# Patient Record
Sex: Female | Born: 1985 | Race: Black or African American | Hispanic: No | Marital: Single | Smoking: Never smoker
Health system: Southern US, Community
[De-identification: ages and names within clinical notes are randomized; demographics above are authoritative.]

## PROBLEM LIST (undated history)

## (undated) DIAGNOSIS — E282 Polycystic ovarian syndrome: Secondary | ICD-10-CM

## (undated) DIAGNOSIS — I10 Essential (primary) hypertension: Secondary | ICD-10-CM

## (undated) DIAGNOSIS — G43909 Migraine, unspecified, not intractable, without status migrainosus: Secondary | ICD-10-CM

## (undated) DIAGNOSIS — F32A Depression, unspecified: Secondary | ICD-10-CM

## (undated) DIAGNOSIS — R32 Unspecified urinary incontinence: Secondary | ICD-10-CM

## (undated) DIAGNOSIS — F329 Major depressive disorder, single episode, unspecified: Secondary | ICD-10-CM

## (undated) DIAGNOSIS — J45909 Unspecified asthma, uncomplicated: Secondary | ICD-10-CM

## (undated) HISTORY — DX: Migraine, unspecified, not intractable, without status migrainosus: G43.909

## (undated) HISTORY — DX: Unspecified asthma, uncomplicated: J45.909

## (undated) HISTORY — PX: TONSILLECTOMY: SUR1361

## (undated) HISTORY — DX: Depression, unspecified: F32.A

## (undated) HISTORY — DX: Essential (primary) hypertension: I10

## (undated) HISTORY — DX: Unspecified urinary incontinence: R32

---
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
## 2006-03-10 ENCOUNTER — Ambulatory Visit (HOSPITAL_COMMUNITY): Admission: RE | Admit: 2006-03-10 | Discharge: 2006-03-10 | Payer: Self-pay | Admitting: Family Medicine

## 2007-04-06 ENCOUNTER — Ambulatory Visit (HOSPITAL_COMMUNITY): Admission: RE | Admit: 2007-04-06 | Discharge: 2007-04-06 | Payer: Self-pay | Admitting: Family Medicine

## 2007-10-05 ENCOUNTER — Observation Stay: Payer: Self-pay | Admitting: General Surgery

## 2008-06-22 DIAGNOSIS — O169 Unspecified maternal hypertension, unspecified trimester: Secondary | ICD-10-CM

## 2008-07-09 ENCOUNTER — Emergency Department: Payer: Self-pay | Admitting: Emergency Medicine

## 2009-02-07 ENCOUNTER — Observation Stay: Payer: Self-pay | Admitting: Obstetrics and Gynecology

## 2009-02-11 ENCOUNTER — Observation Stay: Payer: Self-pay

## 2009-02-15 ENCOUNTER — Observation Stay: Payer: Self-pay | Admitting: Obstetrics and Gynecology

## 2009-02-18 ENCOUNTER — Inpatient Hospital Stay: Payer: Self-pay | Admitting: Unknown Physician Specialty

## 2010-12-10 ENCOUNTER — Other Ambulatory Visit: Payer: Self-pay | Admitting: Obstetrics and Gynecology

## 2010-12-10 DIAGNOSIS — N644 Mastodynia: Secondary | ICD-10-CM

## 2010-12-12 ENCOUNTER — Ambulatory Visit
Admission: RE | Admit: 2010-12-12 | Discharge: 2010-12-12 | Disposition: A | Discharge status: Home / Self Care | Payer: 59 | Source: Ambulatory Visit | Attending: Obstetrics and Gynecology | Admitting: Obstetrics and Gynecology

## 2010-12-12 DIAGNOSIS — N644 Mastodynia: Secondary | ICD-10-CM

## 2014-02-21 ENCOUNTER — Ambulatory Visit: Payer: 59 | Admitting: Cardiology

## 2014-03-13 ENCOUNTER — Encounter: Payer: Self-pay | Admitting: Cardiology

## 2014-08-07 ENCOUNTER — Emergency Department: Payer: Self-pay | Admitting: Student

## 2014-09-15 ENCOUNTER — Emergency Department: Payer: Self-pay | Admitting: Emergency Medicine

## 2015-08-25 ENCOUNTER — Encounter (HOSPITAL_COMMUNITY): Payer: Self-pay

## 2015-08-25 ENCOUNTER — Emergency Department (HOSPITAL_COMMUNITY): Payer: BLUE CROSS/BLUE SHIELD

## 2015-08-25 DIAGNOSIS — Z8639 Personal history of other endocrine, nutritional and metabolic disease: Secondary | ICD-10-CM | POA: Diagnosis not present

## 2015-08-25 DIAGNOSIS — Z880 Allergy status to penicillin: Secondary | ICD-10-CM | POA: Diagnosis not present

## 2015-08-25 DIAGNOSIS — R079 Chest pain, unspecified: Secondary | ICD-10-CM | POA: Insufficient documentation

## 2015-08-25 DIAGNOSIS — R110 Nausea: Secondary | ICD-10-CM | POA: Diagnosis not present

## 2015-08-25 DIAGNOSIS — Z87891 Personal history of nicotine dependence: Secondary | ICD-10-CM | POA: Insufficient documentation

## 2015-08-25 LAB — I-STAT TROPONIN, ED: TROPONIN I, POC: 0 ng/mL (ref 0.00–0.08)

## 2015-08-25 LAB — CBC
HCT: 38 % (ref 36.0–46.0)
Hemoglobin: 12.1 g/dL (ref 12.0–15.0)
MCH: 27.4 pg (ref 26.0–34.0)
MCHC: 31.8 g/dL (ref 30.0–36.0)
MCV: 86.2 fL (ref 78.0–100.0)
PLATELETS: 278 10*3/uL (ref 150–400)
RBC: 4.41 MIL/uL (ref 3.87–5.11)
RDW: 13.6 % (ref 11.5–15.5)
WBC: 8 10*3/uL (ref 4.0–10.5)

## 2015-08-25 NOTE — ED Notes (Signed)
Pt reports onset 7pm left upper chest pain, intermittant shortness of breath and nausea.  NO cold/cough, fever, sweating noted.  NAD at triage.

## 2015-08-26 ENCOUNTER — Emergency Department (HOSPITAL_COMMUNITY)
Admission: EM | Admit: 2015-08-26 | Discharge: 2015-08-26 | Disposition: A | Discharge status: Home / Self Care | Payer: BLUE CROSS/BLUE SHIELD | Attending: Emergency Medicine | Admitting: Emergency Medicine

## 2015-08-26 DIAGNOSIS — R079 Chest pain, unspecified: Secondary | ICD-10-CM

## 2015-08-26 HISTORY — DX: Polycystic ovarian syndrome: E28.2

## 2015-08-26 LAB — BASIC METABOLIC PANEL
Anion gap: 11 (ref 5–15)
BUN: 9 mg/dL (ref 6–20)
CO2: 27 mmol/L (ref 22–32)
CREATININE: 1.05 mg/dL — AB (ref 0.44–1.00)
Calcium: 9 mg/dL (ref 8.9–10.3)
Chloride: 102 mmol/L (ref 101–111)
GFR calc Af Amer: >60 mL/min (ref >60)
GLUCOSE: 100 mg/dL — AB (ref 65–99)
POTASSIUM: 3.6 mmol/L (ref 3.5–5.1)
Sodium: 140 mmol/L (ref 135–145)

## 2015-08-26 MED ORDER — IBUPROFEN 400 MG PO TABS: 400.0000 mg | ORAL_TABLET | Freq: Four times a day (QID) | ORAL | Status: DC | PRN

## 2015-08-26 NOTE — ED Notes (Signed)
Patient verbalized understanding of discharge instructions and denies any further needs or questions at this time. VS stable. Patient ambulatory with steady gait. Declined wheelchair.

## 2015-08-26 NOTE — ED Provider Notes (Signed)
CSN: [id number]     Arrival date & time 08/25/15  2248 History  By signing my name below, I, [name], attest that this documentation has been prepared under the direction and in the presence of Benjiman CoreNathan Nyra Anspaugh, MD. Electronically Signed: [name], ED Scribe. 08/26/2015. 2:51 AM.     Chief Complaint  Patient presents with  . Chest Pain   The history is provided by the patient. No language interpreter was used.  HPI Comments: Shannon [name] is a 30 y.o. female who presents to the Emergency Department complaining of sudden onset, throbbing, non-radiating, gradually resolving left upper chest pain onset 7 hours ago. Pt reports associated nausea, tingling and "heaviness" of the left arm. She denies worsening or alleviating factors. Pt is on BC. She reports hx of similar symptoms months ago that went away on its own. She denies hx of smoking, new physical activity, fever, chills, diaphoresis, cough, leg swelling, current SOB, vomiting, or diarrhea.   Past Medical History  Diagnosis Date  . PCOS (polycystic ovarian syndrome)    Past Surgical History  Procedure Laterality Date  . Tonsillectomy     History reviewed. No pertinent family history. Social History  Substance Use Topics  . Smoking status: Former Games developermoker  . Smokeless tobacco: None  . Alcohol Use: No   OB History    No data available     Review of Systems  Constitutional: Negative for fever and diaphoresis.  Respiratory: Negative for cough and shortness of breath.   Cardiovascular: Positive for chest pain. Negative for leg swelling.  Gastrointestinal: Positive for nausea. Negative for vomiting and diarrhea.  All other systems reviewed and are negative.  Allergies  Clindamycin/lincomycin and Penicillins  Home Medications   Prior to Admission medications   Medication Sig Start Date End Date Taking? Authorizing Provider  ibuprofen (ADVIL,MOTRIN) 400 MG tablet Take 1 tablet (400 mg total) by mouth every 6 (six)  hours as needed. 08/26/15   Benjiman CoreNathan Roshan Salamon, MD   BP 133/79 mmHg  Pulse 79  Temp(Src) 98.7 F (37.1 C) (Oral)  Resp 15  Ht 5\' 8"  (1.727 m)  Wt 213 lb (96.616 kg)  BMI 32.39 kg/m2  SpO2 100%  LMP  Physical Exam  Constitutional: She is oriented to person, place, and time. She appears well-developed and well-nourished.  HENT:  Head: Normocephalic and atraumatic.  Eyes: EOM are normal. Pupils are equal, round, and reactive to light.  Neck: Normal range of motion. Neck supple.  Cardiovascular: Normal rate, regular rhythm and normal heart sounds.  Exam reveals no gallop and no friction rub.   No murmur heard. Pulmonary/Chest: Effort normal and breath sounds normal. She has no wheezes. She exhibits no tenderness.  Abdominal: Soft. There is no tenderness.  Musculoskeletal: Normal range of motion. She exhibits no edema.  Neurological: She is alert and oriented to person, place, and time.  Skin: Skin is warm and dry.  Psychiatric: She has a normal mood and affect. Her behavior is normal.  Nursing note and vitals reviewed.   ED Course  Procedures (including critical care time) DIAGNOSTIC STUDIES: Oxygen Saturation is 100% on RA, normal by my interpretation.    COORDINATION OF CARE: 2:50 AM-Discussed treatment plan which includes labs, EKG, x-ray, and anti-inflammatories with pt at bedside and pt agreed to plan.    Labs Review Labs Reviewed  BASIC METABOLIC PANEL - Abnormal; Notable for the following:    Glucose, Bld 100 (*)    Creatinine, Ser 1.05 (*)  All other components within normal limits  CBC  I-STAT TROPOININ, ED    Imaging Review Dg Chest 2 View  08/25/2015  CLINICAL DATA:  Chest pain and shortness of breath since 7 p.m. today. EXAM: CHEST  2 VIEW COMPARISON:  10/05/2007 FINDINGS: The heart size and mediastinal contours are within normal limits. Both lungs are clear. The visualized skeletal structures are unremarkable. IMPRESSION: No active cardiopulmonary disease.  Electronically Signed   By: Burman Nieves M.D.   On: 08/25/2015 23:53   I have personally reviewed and evaluated these images and lab results as part of my medical decision-making.   EKG Interpretation   Date/Time:  Sunday August 25 2015 22:51:52 EST Ventricular Rate:  83 PR Interval:  122 QRS Duration: 92 QT Interval:  352 QTC Calculation: 413 R Axis:   58 Text Interpretation:  Normal sinus rhythm Normal ECG Confirmed by  Dain Laseter  MD, Maryse Brierley [phone]) on 08/26/2015 2:42:51 AM      MDM   Final diagnoses:  Chest pain, unspecified chest pain type    Patient with chest pain. Left-sided with some radiation the arm. Enzymes negative. EKG reassuring. Negative chest x-ray. Low risk for pulmonary embolism. No abdominal pain. Doubt aortic cause. Will discharge home. I personally performed the services described in this documentation, which was scribed in my presence. The recorded information has been reviewed and is accurate.      Benjiman Core, MD 08/26/15 [phone]

## 2015-08-26 NOTE — Discharge Instructions (Signed)
Nonspecific Chest Pain  °Chest pain can be caused by many different conditions. There is always a chance that your pain could be related to something serious, such as a heart attack or a blood clot in your lungs. Chest pain can also be caused by conditions that are not life-threatening. If you have chest pain, it is very important to follow up with your health care provider. °CAUSES  °Chest pain can be caused by: °· Heartburn. °· Pneumonia or bronchitis. °· Anxiety or stress. °· Inflammation around your heart (pericarditis) or lung (pleuritis or pleurisy). °· A blood clot in your lung. °· A collapsed lung (pneumothorax). It can develop suddenly on its own (spontaneous pneumothorax) or from trauma to the chest. °· Shingles infection (varicella-zoster virus). °· Heart attack. °· Damage to the bones, muscles, and cartilage that make up your chest wall. This can include: °¨ Bruised bones due to injury. °¨ Strained muscles or cartilage due to frequent or repeated coughing or overwork. °¨ Fracture to one or more ribs. °¨ Sore cartilage due to inflammation (costochondritis). °RISK FACTORS  °Risk factors for chest pain may include: °· Activities that increase your risk for trauma or injury to your chest. °· Respiratory infections or conditions that cause frequent coughing. °· Medical conditions or overeating that can cause heartburn. °· Heart disease or family history of heart disease. °· Conditions or health behaviors that increase your risk of developing a blood clot. °· Having had chicken pox (varicella zoster). °SIGNS AND SYMPTOMS °Chest pain can feel like: °· Burning or tingling on the surface of your chest or deep in your chest. °· Crushing, pressure, aching, or squeezing pain. °· Dull or sharp pain that is worse when you move, cough, or take a deep breath. °· Pain that is also felt in your back, neck, shoulder, or arm, or pain that spreads to any of these areas. °Your chest pain may come and go, or it may stay  constant. °DIAGNOSIS °Lab tests or other studies may be needed to find the cause of your pain. Your health care provider may have you take a test called an ambulatory ECG (electrocardiogram). An ECG records your heartbeat patterns at the time the test is performed. You may also have other tests, such as: °· Transthoracic echocardiogram (TTE). During echocardiography, sound waves are used to create a picture of all of the heart structures and to look at how blood flows through your heart. °· Transesophageal echocardiogram (TEE). This is a more advanced imaging test that obtains images from inside your body. It allows your health care provider to see your heart in finer detail. °· Cardiac monitoring. This allows your health care provider to monitor your heart rate and rhythm in real time. °· Holter monitor. This is a portable device that records your heartbeat and can help to diagnose abnormal heartbeats. It allows your health care provider to track your heart activity for several days, if needed. °· Stress tests. These can be done through exercise or by taking medicine that makes your heart beat more quickly. °· Blood tests. °· Imaging tests. °TREATMENT  °Your treatment depends on what is causing your chest pain. Treatment may include: °· Medicines. These may include: °¨ Acid blockers for heartburn. °¨ Anti-inflammatory medicine. °¨ Pain medicine for inflammatory conditions. °¨ Antibiotic medicine, if an infection is present. °¨ Medicines to dissolve blood clots. °¨ Medicines to treat coronary artery disease. °· Supportive care for conditions that do not require medicines. This may include: °¨ Resting. °¨ Applying heat   or cold packs to injured areas. °¨ Limiting activities until pain decreases. °HOME CARE INSTRUCTIONS °· If you were prescribed an antibiotic medicine, finish it all even if you start to feel better. °· Avoid any activities that bring on chest pain. °· Do not use any tobacco products, including  cigarettes, chewing tobacco, or electronic cigarettes. If you need help quitting, ask your health care provider. °· Do not drink alcohol. °· Take medicines only as directed by your health care provider. °· Keep all follow-up visits as directed by your health care provider. This is important. This includes any further testing if your chest pain does not go away. °· If heartburn is the cause for your chest pain, you may be told to keep your head raised (elevated) while sleeping. This reduces the chance that acid will go from your stomach into your esophagus. °· Make lifestyle changes as directed by your health care provider. These may include: °¨ Getting regular exercise. Ask your health care provider to suggest some activities that are safe for you. °¨ Eating a heart-healthy diet. A registered dietitian can help you to learn healthy eating options. °¨ Maintaining a healthy weight. °¨ Managing diabetes, if necessary. °¨ Reducing stress. °SEEK MEDICAL CARE IF: °· Your chest pain does not go away after treatment. °· You have a rash with blisters on your chest. °· You have a fever. °SEEK IMMEDIATE MEDICAL CARE IF:  °· Your chest pain is worse. °· You have an increasing cough, or you cough up blood. °· You have severe abdominal pain. °· You have severe weakness. °· You faint. °· You have chills. °· You have sudden, unexplained chest discomfort. °· You have sudden, unexplained discomfort in your arms, back, neck, or jaw. °· You have shortness of breath at any time. °· You suddenly start to sweat, or your skin gets clammy. °· You feel nauseous or you vomit. °· You suddenly feel light-headed or dizzy. °· Your heart begins to beat quickly, or it feels like it is skipping beats. °These symptoms may represent a serious problem that is an emergency. Do not wait to see if the symptoms will go away. Get medical help right away. Call your local emergency services (911 in the [place] Do not drive yourself to the hospital. °  °This  information is not intended to replace advice given to you by your health care provider. Make sure you discuss any questions you have with your health care provider. °  °Document Released: 03/18/2005 Document Revised: 06/29/2014 Document Reviewed: 01/12/2014 °Elsevier Interactive Patient Education ©2016 [employer] ° °

## 2015-09-30 ENCOUNTER — Encounter: Payer: Self-pay | Admitting: Emergency Medicine

## 2015-09-30 DIAGNOSIS — T17228A Food in pharynx causing other injury, initial encounter: Secondary | ICD-10-CM | POA: Diagnosis not present

## 2015-09-30 DIAGNOSIS — Y999 Unspecified external cause status: Secondary | ICD-10-CM | POA: Diagnosis not present

## 2015-09-30 DIAGNOSIS — W458XXA Other foreign body or object entering through skin, initial encounter: Secondary | ICD-10-CM | POA: Diagnosis not present

## 2015-09-30 DIAGNOSIS — Y929 Unspecified place or not applicable: Secondary | ICD-10-CM | POA: Diagnosis not present

## 2015-09-30 DIAGNOSIS — Y9389 Activity, other specified: Secondary | ICD-10-CM | POA: Diagnosis not present

## 2015-09-30 DIAGNOSIS — Z87891 Personal history of nicotine dependence: Secondary | ICD-10-CM | POA: Insufficient documentation

## 2015-09-30 NOTE — ED Notes (Signed)
Patient ambulatory to triage with steady gait, without difficulty or distress noted; pt reports eating fish & feels as if something stuck in throat; [facility] and has been there since 330pm; left due to long wait

## 2015-10-01 ENCOUNTER — Emergency Department
Admission: EM | Admit: 2015-10-01 | Discharge: 2015-10-01 | Disposition: A | Discharge status: Home / Self Care | Payer: BLUE CROSS/BLUE SHIELD | Attending: Emergency Medicine | Admitting: Emergency Medicine

## 2015-10-01 ENCOUNTER — Emergency Department: Payer: BLUE CROSS/BLUE SHIELD

## 2015-10-01 DIAGNOSIS — R0989 Other specified symptoms and signs involving the circulatory and respiratory systems: Secondary | ICD-10-CM

## 2015-10-01 MED ORDER — MAGIC MOUTHWASH
10.0000 mL | Freq: Once | ORAL | Status: AC
  Administered 2015-10-01: 10 mL via ORAL
  Filled 2015-10-01: qty 10

## 2015-10-01 MED ORDER — HYDROCODONE-ACETAMINOPHEN 7.5-325 MG/15ML PO SOLN: 10.0000 mL | Freq: Four times a day (QID) | ORAL | Status: AC | PRN

## 2015-10-01 MED ORDER — MAGIC MOUTHWASH: 5.0000 mL | Freq: Three times a day (TID) | ORAL | Status: DC | PRN

## 2015-10-01 MED ORDER — HYDROCODONE-ACETAMINOPHEN 7.5-325 MG/15ML PO SOLN
10.0000 mL | Freq: Once | ORAL | Status: AC
  Administered 2015-10-01: 10 mL via ORAL
  Filled 2015-10-01: qty 15

## 2015-10-01 NOTE — ED Notes (Signed)
Ginger ale given to pt.  

## 2015-10-01 NOTE — ED Provider Notes (Signed)
[facility] Emergency Department Provider Note  ____________________________________________  Time seen: Approximately 1:20 AM  I have reviewed the triage vital signs and the nursing notes.   HISTORY  Chief Complaint Foreign Body    HPI Shannon [name] is a 30 y.o. female who presents to the ED from home with a chief complaint of foreign body sensation. Patient reports she was eating fish and swallowed a fishbone at approximately 2 PM. Reports [facility], waited [facility] hours, left and presented to our emergency department. Patient states she felt like the fishbone was lodged horizontally in her throat for approximately one hour, then started to work its way down vertically. Reports that she is able to swallow liquids without difficulty. Denies associated fever, chills, chest pain, shortness of breath, drooling, muffled voice, abdominal pain, nausea, vomiting, diarrhea. Denies recent travel or trauma. Nothing makes her symptoms better or worse.   Past Medical History  Diagnosis Date  . PCOS (polycystic ovarian syndrome)     There are no active problems to display for this patient.   Past Surgical History  Procedure Laterality Date  . Tonsillectomy      Current Outpatient Rx  Name  Route  Sig  Dispense  Refill  . ibuprofen (ADVIL,MOTRIN) 400 MG tablet   Oral   Take 1 tablet (400 mg total) by mouth every 6 (six) hours as needed.   10 tablet   0     Allergies Clindamycin/lincomycin and Penicillins  No family history on file.  Social History Social History  Substance Use Topics  . Smoking status: Former Games developermoker  . Smokeless tobacco: None  . Alcohol Use: No    Review of Systems  Constitutional: No fever/chills. Eyes: No visual changes. ENT: Positive for foreign body sensation in throat. No sore throat. Cardiovascular: Denies chest pain. Respiratory: Denies shortness of breath. Gastrointestinal: No abdominal pain.  No nausea, no vomiting.   No diarrhea.  No constipation. Genitourinary: Negative for dysuria. Musculoskeletal: Negative for back pain. Skin: Negative for rash. Neurological: Negative for headaches, focal weakness or numbness.  10-point ROS otherwise negative.  ____________________________________________   PHYSICAL EXAM:  VITAL SIGNS: ED Triage Vitals  Enc Vitals Group     BP 09/30/15 2356 135/83 mmHg     Pulse Rate 09/30/15 2356 73     Resp 09/30/15 2356 20     Temp 09/30/15 2356 97.9 F (36.6 C)     Temp Source 09/30/15 2356 Oral     SpO2 09/30/15 2356 99 %     Weight 09/30/15 2356 212 lb (96.163 kg)     Height 09/30/15 2356 5\' 7"  (1.702 m)     Head Cir --      Peak Flow --      Pain Score 09/30/15 2355 7     Pain Loc --      Pain Edu? --      Excl. in GC? --     Constitutional: Alert and oriented. Well appearing and in no acute distress. Eyes: Conjunctivae are normal. PERRL. EOMI. Head: Atraumatic. Nose: No congestion/rhinnorhea. Mouth/Throat: Mucous membranes are moist.  Oropharynx slightly erythematous.  No FB visible. Neck: No stridor.  No palpable FB. No crepitus. Cardiovascular: Normal rate, regular rhythm. Grossly normal heart sounds.  Good peripheral circulation. Respiratory: Normal respiratory effort.  No retractions. Lungs CTAB. Gastrointestinal: Soft and nontender. No distention. No abdominal bruits. No CVA tenderness. Musculoskeletal: No lower extremity tenderness nor edema.  No joint effusions. Neurologic:  Normal  speech and language. No gross focal neurologic deficits are appreciated. No gait instability. Skin:  Skin is warm, dry and intact. No rash noted. Psychiatric: Mood and affect are normal. Speech and behavior are normal.  ____________________________________________   LABS (all labs ordered are listed, but only abnormal results are displayed)  Labs Reviewed - No data to  display ____________________________________________  EKG  None ____________________________________________  RADIOLOGY  Soft tissue neck (viewed by me, interpreted per Dr. Clovis Riley): Negative ____________________________________________   PROCEDURES  Procedure(s) performed: None  Critical Care performed: No  ____________________________________________   INITIAL IMPRESSION / ASSESSMENT AND PLAN / ED COURSE  Pertinent labs & imaging results that were available during my care of the patient were reviewed by me and considered in my medical decision making (see chart for details).  30 year old female who presents with foreign body sensation in throat. She is not in respiratory distress, no drooling or spitting. Will try Magic mouthwash and reassess.  ----------------------------------------- 2:55 AM on 10/01/2015 -----------------------------------------  Patient is feeling much better. Denies foreign body sensation. Complains of soreness to throat. Will add Lortab elixir. Strict return precautions given. Patient and spouse verbalize understanding and agree with plan of care. ____________________________________________   FINAL CLINICAL IMPRESSION(S) / ED DIAGNOSES  Final diagnoses:  Sensation of foreign body in throat      Shannon Hong, MD 10/01/15 [phone]

## 2015-10-01 NOTE — Discharge Instructions (Signed)
1. You may use Magic mouthwash and Lortab elixir as needed for throat discomfort. 2. Return to the ER for worsening symptoms, persistent vomiting, fever, difficulty breathing or other concerns.

## 2015-10-01 NOTE — ED Notes (Signed)
Pt states she was eating a fish and swallowed a bone. Pt [facility], left and came here. Pt talking in complete sentences, states she is spitting. Pt states she is able to swallow. Denies coughing, has not taken anything for pain.

## 2017-01-13 DIAGNOSIS — K5900 Constipation, unspecified: Secondary | ICD-10-CM | POA: Insufficient documentation

## 2017-01-13 DIAGNOSIS — K644 Residual hemorrhoidal skin tags: Secondary | ICD-10-CM | POA: Insufficient documentation

## 2017-01-13 DIAGNOSIS — N3011 Interstitial cystitis (chronic) with hematuria: Secondary | ICD-10-CM | POA: Insufficient documentation

## 2017-01-14 ENCOUNTER — Emergency Department: Payer: Self-pay

## 2017-01-14 ENCOUNTER — Emergency Department
Admission: EM | Admit: 2017-01-14 | Discharge: 2017-01-14 | Disposition: A | Discharge status: Home / Self Care | Payer: Self-pay | Attending: Emergency Medicine | Admitting: Emergency Medicine

## 2017-01-14 ENCOUNTER — Encounter: Payer: Self-pay | Admitting: Emergency Medicine

## 2017-01-14 DIAGNOSIS — N3001 Acute cystitis with hematuria: Secondary | ICD-10-CM

## 2017-01-14 DIAGNOSIS — K644 Residual hemorrhoidal skin tags: Secondary | ICD-10-CM

## 2017-01-14 DIAGNOSIS — K5900 Constipation, unspecified: Secondary | ICD-10-CM

## 2017-01-14 LAB — COMPREHENSIVE METABOLIC PANEL
ALBUMIN: 3.9 g/dL (ref 3.5–5.0)
ALK PHOS: 78 U/L (ref 38–126)
ALT: 10 U/L — ABNORMAL LOW (ref 14–54)
ANION GAP: 7 (ref 5–15)
AST: 16 U/L (ref 15–41)
BUN: 14 mg/dL (ref 6–20)
CALCIUM: 9 mg/dL (ref 8.9–10.3)
CHLORIDE: 103 mmol/L (ref 101–111)
CO2: 30 mmol/L (ref 22–32)
Creatinine, Ser: 1.1 mg/dL — ABNORMAL HIGH (ref 0.44–1.00)
GFR calc non Af Amer: >60 mL/min (ref >60)
GLUCOSE: 95 mg/dL (ref 65–99)
POTASSIUM: 3.8 mmol/L (ref 3.5–5.1)
SODIUM: 140 mmol/L (ref 135–145)
Total Bilirubin: 0.4 mg/dL (ref 0.3–1.2)
Total Protein: 8 g/dL (ref 6.5–8.1)

## 2017-01-14 LAB — CBC
HEMATOCRIT: 37.4 % (ref 35.0–47.0)
Hemoglobin: 12.4 g/dL (ref 12.0–16.0)
MCH: 28.1 pg (ref 26.0–34.0)
MCHC: 33.2 g/dL (ref 32.0–36.0)
MCV: 84.7 fL (ref 80.0–100.0)
Platelets: 255 10*3/uL (ref 150–440)
RBC: 4.41 MIL/uL (ref 3.80–5.20)
RDW: 13.8 % (ref 11.5–14.5)
WBC: 8.7 10*3/uL (ref 3.6–11.0)

## 2017-01-14 LAB — URINALYSIS, COMPLETE (UACMP) WITH MICROSCOPIC
BACTERIA UA: NONE SEEN
BILIRUBIN URINE: NEGATIVE
Glucose, UA: NEGATIVE mg/dL
Ketones, ur: NEGATIVE mg/dL
NITRITE: NEGATIVE
PROTEIN: NEGATIVE mg/dL
Specific Gravity, Urine: 1.024 (ref 1.005–1.030)
pH: 5 (ref 5.0–8.0)

## 2017-01-14 LAB — POCT PREGNANCY, URINE: PREG TEST UR: NEGATIVE

## 2017-01-14 LAB — LIPASE, BLOOD: LIPASE: 24 U/L (ref 11–51)

## 2017-01-14 MED ORDER — MINERAL OIL RE ENEM
1.0000 | ENEMA | Freq: Once | RECTAL | Status: AC
  Administered 2017-01-14: 1 via RECTAL

## 2017-01-14 MED ORDER — CIPROFLOXACIN HCL 500 MG PO TABS
500.0000 mg | ORAL_TABLET | Freq: Once | ORAL | Status: AC
  Administered 2017-01-14: 500 mg via ORAL
  Filled 2017-01-14: qty 1

## 2017-01-14 MED ORDER — MAGNESIUM CITRATE PO SOLN
1.0000 | Freq: Once | ORAL | Status: AC
  Administered 2017-01-14: 1 via ORAL
  Filled 2017-01-14: qty 296

## 2017-01-14 MED ORDER — CIPROFLOXACIN HCL 500 MG PO TABS: 500.0000 mg | ORAL_TABLET | Freq: Two times a day (BID) | ORAL | 0 refills | Status: AC

## 2017-01-14 MED ORDER — DOCUSATE SODIUM 50 MG/5ML PO LIQD
100.0000 mg | Freq: Once | ORAL | Status: AC
  Administered 2017-01-14: 100 mg via ORAL
  Filled 2017-01-14: qty 10

## 2017-01-14 NOTE — ED Provider Notes (Signed)
[facility] Emergency Department Provider Note    First MD Initiated Contact with Patient 01/14/17 [phone]     (approximate)  I have reviewed the triage vital signs and the nursing notes.   HISTORY  Chief Complaint Abdominal Pain    HPI Shannon [name] is a 31 y.o. female with history of PCOS resents to the emergency department with multiple medical complaints including lower abdominal discomfort, constipation times approximately greater than one week. Patient also admits to urinary frequency however no dysuria. Patient denies any fever afebrile on presentation. Patient admits to very hard stool with history of hemorrhoids and bright red blood noted in the stool. Patient states her current pain score is 5 out of   Past Medical History:  Diagnosis Date  . PCOS (polycystic ovarian syndrome)     There are no active problems to display for this patient.   Past Surgical History:  Procedure Laterality Date  . TONSILLECTOMY      Prior to Admission medications   Medication Sig Start Date End Date Taking? Authorizing Provider  ibuprofen (ADVIL,MOTRIN) 400 MG tablet Take 1 tablet (400 mg total) by mouth every 6 (six) hours as needed. 08/26/15   Benjiman CorePickering, Nathan, MD  magic mouthwash SOLN Take 5 mLs by mouth 3 (three) times daily as needed for mouth pain. 10/01/15   Irean HongSung, Jade J, MD    Allergies Clindamycin/lincomycin and Penicillins  No family history on file.  Social History Social History  Substance Use Topics  . Smoking status: Never Smoker  . Smokeless tobacco: Never Used  . Alcohol use No    Review of Systems Constitutional: No fever/chills Eyes: No visual changes. ENT: No sore throat. Cardiovascular: Denies chest pain. Respiratory: Denies shortness of breath. Gastrointestinal: Positive for abdominal pain and constipation  Genitourinary: Negative for dysuria. Musculoskeletal: Negative for neck pain.  Negative for back pain. Integumentary:  Negative for rash. Neurological: Negative for headaches, focal weakness or numbness.   ____________________________________________   PHYSICAL EXAM:  VITAL SIGNS: ED Triage Vitals  Enc Vitals Group     BP 01/14/17 0012 139/78     Pulse Rate 01/14/17 0012 87     Resp 01/14/17 0012 12     Temp 01/14/17 0012 98.9 F (37.2 C)     Temp Source 01/14/17 0012 Oral     SpO2 01/14/17 0012 100 %     Weight 01/14/17 0012 100.7 kg (222 lb)     Height 01/14/17 0012 1.702 m (5\' 7" )     Head Circumference --      Peak Flow --      Pain Score 01/14/17 0022 5     Pain Loc --      Pain Edu? --      Excl. in GC? --     Constitutional: Alert and oriented. Well appearing and in no acute distress. Eyes: Conjunctivae are normal.  Head: Atraumatic. Mouth/Throat: Mucous membranes are moist.  Oropharynx non-erythematous. Neck: No stridor.  Cardiovascular: Normal rate, regular rhythm. Good peripheral circulation. Grossly normal heart sounds. Respiratory: Normal respiratory effort.  No retractions. Lungs CTAB. Gastrointestinal: Soft and nontender. No distention.  Musculoskeletal: No lower extremity tenderness nor edema. No gross deformities of extremities. Neurologic:  Normal speech and language. No gross focal neurologic deficits are appreciated.  Skin:  Skin is warm, dry and intact. No rash noted. Psychiatric: Mood and affect are normal. Speech and behavior are normal.  ____________________________________________   LABS (all labs ordered are listed, but only  abnormal results are displayed)  Labs Reviewed  COMPREHENSIVE METABOLIC PANEL - Abnormal; Notable for the following:       Result Value   Creatinine, Ser 1.10 (*)    ALT 10 (*)    All other components within normal limits  URINALYSIS, COMPLETE (UACMP) WITH MICROSCOPIC - Abnormal; Notable for the following:    Color, Urine YELLOW (*)    APPearance HAZY (*)    Hgb urine dipstick LARGE (*)    Leukocytes, UA LARGE (*)    Squamous  Epithelial / LPF 6-30 (*)    All other components within normal limits  LIPASE, BLOOD  CBC  POC URINE PREG, ED  POCT PREGNANCY, URINE    RADIOLOGY I, [place] N BROWN, personally viewed and evaluated these images (plain radiographs) as part of my medical decision making, as well as reviewing the written report by the radiologist.  Dg Abdomen 1 View  Result Date: 01/14/2017 CLINICAL DATA:  Lower abdominal pain tonight. EXAM: ABDOMEN - 1 VIEW COMPARISON:  None. FINDINGS: The bowel gas pattern is normal. No radio-opaque calculi or other significant radiographic abnormality are seen. Left convex lumbar curvature is unchanged from CT of 10/05/2007. IMPRESSION: Negative. Electronically Signed   By: Ellery Plunkaniel R Mitchell M.D.   On: 01/14/2017 05:12      Procedures   ____________________________________________   INITIAL IMPRESSION / ASSESSMENT AND PLAN / ED COURSE  Pertinent labs & imaging results that were available during my care of the patient were reviewed by me and considered in my medical decision making (see chart for details).  Patient given Cipro in the emergency department and soapsuds enema with excellent results.      ____________________________________________  FINAL CLINICAL IMPRESSION(S) / ED DIAGNOSES  Final diagnoses:  Constipation, unspecified constipation type  Acute cystitis with hematuria  External hemorrhoids     MEDICATIONS GIVEN DURING THIS VISIT:  Medications  ciprofloxacin (CIPRO) tablet 500 mg (500 mg Oral Given 01/14/17 0525)  magnesium citrate solution 1 Bottle (1 Bottle Oral Given 01/14/17 0537)  docusate (COLACE) 50 MG/5ML liquid 100 mg (100 mg Oral Given 01/14/17 0538)  mineral oil enema 1 enema (1 enema Rectal Given 01/14/17 0538)     NEW OUTPATIENT MEDICATIONS STARTED DURING THIS VISIT:  New Prescriptions   No medications on file    Modified Medications   No medications on file    Discontinued Medications   No medications on file       Note:  This document was prepared using Dragon voice recognition software and may include unintentional dictation errors.    Darci CurrentBrown, [place] N, MD 01/14/17 [phone]

## 2017-01-14 NOTE — ED Notes (Signed)
Pt. Going home by self. 

## 2017-01-14 NOTE — ED Notes (Signed)
Pt. States having the GI bug a couple weeks ago and has not had normal bowel movement from that date.  Pt. States having abdominal pain for the past 3 days.  Pt. Denies vomiting.

## 2017-01-14 NOTE — ED Triage Notes (Signed)
Pt comes into the ED via POV c/o lower abdominal pain that started three days ago.  Patient states she had a stomach virus a couple of weeks ago and has not been the same since then.  States she is constipated and has only been able to pass small amounts of stool.  Patient states she has had bright red blood present when her stool have been hard and she has a h/o hemorrhoids with her past pregnancies.  Patient has mild nausea, denies chest pain or shortness of breath.  Patient in NAD at this time.

## 2017-01-14 NOTE — ED Notes (Signed)
Pt. Had medium/large bowel movement.  Pt. States she feels better.

## 2017-01-22 ENCOUNTER — Encounter: Payer: Self-pay | Admitting: Obstetrics and Gynecology

## 2017-01-22 ENCOUNTER — Ambulatory Visit (INDEPENDENT_AMBULATORY_CARE_PROVIDER_SITE_OTHER): Payer: Self-pay | Admitting: Obstetrics and Gynecology

## 2017-01-22 VITALS — BP 112/74 | HR 85 | Ht 67.0 in | Wt 225.0 lb

## 2017-01-22 DIAGNOSIS — E282 Polycystic ovarian syndrome: Secondary | ICD-10-CM

## 2017-01-22 DIAGNOSIS — N6452 Nipple discharge: Secondary | ICD-10-CM

## 2017-01-22 DIAGNOSIS — N3001 Acute cystitis with hematuria: Secondary | ICD-10-CM

## 2017-01-22 NOTE — Progress Notes (Signed)
HPI:      [name] is a 31 y.o. G1P1001 who LMP was No LMP recorded (lmp unknown). Patient is not currently having periods (Reason: Irregular Periods).  Subjective:   She presents today As a follow-up to her emergency department visit. She was diagnosed with a urinary tract infection. She has been taking antibiotics and feels much better. She has had irregular cycles for her whole life and has previously been diagnosed with PCO. She is not currently on OCPs because her insurance has lapsed. She complains of bilateral milky breast discharge occasionally.    Hx: The following portions of the patient's history were reviewed and updated as appropriate:             She  has a past medical history of PCOS (polycystic ovarian syndrome). She  does not have a problem list on file. She  has a past surgical history that includes Tonsillectomy. Her family history includes Cancer in her paternal grandmother; Diabetes in her maternal aunt and paternal aunt; Stroke in her maternal grandmother and paternal grandfather. She  reports that she has never smoked. She has never used smokeless tobacco. She reports that she does not drink alcohol or use drugs. She is allergic to clindamycin/lincomycin and penicillins.       Review of Systems:  Review of Systems  Constitutional: Denied constitutional symptoms, night sweats, recent illness, fatigue, fever, insomnia and weight loss.  Eyes: Denied eye symptoms, eye pain, photophobia, vision change and visual disturbance.  Ears/Nose/Throat/Neck: Denied ear, nose, throat or neck symptoms, hearing loss, nasal discharge, sinus congestion and sore throat.  Cardiovascular: Denied cardiovascular symptoms, arrhythmia, chest pain/pressure, edema, exercise intolerance, orthopnea and palpitations.  Respiratory: Denied pulmonary symptoms, asthma, pleuritic pain, productive sputum, cough, dyspnea and wheezing.  Gastrointestinal: Denied, gastro-esophageal reflux, melena,  nausea and vomiting.  Genitourinary: See HPI for additional information.  Musculoskeletal: Denied musculoskeletal symptoms, stiffness, swelling, muscle weakness and myalgia.  Dermatologic: Denied dermatology symptoms, rash and scar.  Neurologic: Denied neurology symptoms, dizziness, headache, neck pain and syncope.  Psychiatric: Denied psychiatric symptoms, anxiety and depression.  Endocrine: Denied endocrine symptoms including hot flashes and night sweats.   Meds:   Current Outpatient Prescriptions on File Prior to Visit  Medication Sig Dispense Refill  . ibuprofen (ADVIL,MOTRIN) 400 MG tablet Take 1 tablet (400 mg total) by mouth every 6 (six) hours as needed. 10 tablet 0  . magic mouthwash SOLN Take 5 mLs by mouth 3 (three) times daily as needed for mouth pain. (Patient not taking: Reported on 01/22/2017) 75 mL 0   No current facility-administered medications on file prior to visit.     Objective:     Vitals:   01/22/17 1029  BP: 112/74  Pulse: 85                Assessment:    G1P1001 There are no active problems to display for this patient.    1. Acute cystitis with hematuria   2. Breast discharge   3. PCO (polycystic ovaries)     Cystitis resolved with antibiotics  Breast discharge likely from elevated prolactin although not tested.  Patient with a known history of PCO and would like to be on OCPs however her insurance has lapsed   Plan:            1.  Patient has expressed interest in returning to her previous gynecologist in [place]. She will resume OCPs at that time and have her prolactin tested.  I  have offered to order that today and she would prefer to go to the other office and resume her care there.        F/U  No Follow-up on file. I spent 31 minutes with this patient of which greater than 50% was spent discussing her emergency department visit, urinary tract infection signs and symptoms, history of PCO and treatment options, use of OCPs, breast  discharge and pituitary microadenomas with elevated prolactin.  Elonda Huskyavid J. Jerrald Doverspike, M.D. 01/22/2017 1:02 PM

## 2018-06-27 ENCOUNTER — Emergency Department
Admission: EM | Admit: 2018-06-27 | Discharge: 2018-06-27 | Disposition: A | Discharge status: Home / Self Care | Payer: Medicaid Other | Attending: Emergency Medicine | Admitting: Emergency Medicine

## 2018-06-27 ENCOUNTER — Emergency Department: Payer: Medicaid Other

## 2018-06-27 ENCOUNTER — Other Ambulatory Visit: Payer: Self-pay

## 2018-06-27 ENCOUNTER — Encounter: Payer: Self-pay | Admitting: Emergency Medicine

## 2018-06-27 DIAGNOSIS — R232 Flushing: Secondary | ICD-10-CM | POA: Insufficient documentation

## 2018-06-27 DIAGNOSIS — R0789 Other chest pain: Secondary | ICD-10-CM | POA: Insufficient documentation

## 2018-06-27 DIAGNOSIS — R42 Dizziness and giddiness: Secondary | ICD-10-CM | POA: Insufficient documentation

## 2018-06-27 LAB — BASIC METABOLIC PANEL
ANION GAP: 6 (ref 5–15)
BUN: 10 mg/dL (ref 6–20)
CALCIUM: 8.9 mg/dL (ref 8.9–10.3)
CO2: 30 mmol/L (ref 22–32)
CREATININE: 1.02 mg/dL — AB (ref 0.44–1.00)
Chloride: 103 mmol/L (ref 98–111)
GFR calc Af Amer: >60 mL/min (ref >60)
GLUCOSE: 101 mg/dL — AB (ref 70–99)
Potassium: 3.6 mmol/L (ref 3.5–5.1)
Sodium: 139 mmol/L (ref 135–145)

## 2018-06-27 LAB — CBC
HCT: 38.4 % (ref 36.0–46.0)
Hemoglobin: 12 g/dL (ref 12.0–15.0)
MCH: 26.8 pg (ref 26.0–34.0)
MCHC: 31.3 g/dL (ref 30.0–36.0)
MCV: 85.9 fL (ref 80.0–100.0)
PLATELETS: 330 10*3/uL (ref 150–400)
RBC: 4.47 MIL/uL (ref 3.87–5.11)
RDW: 13.9 % (ref 11.5–15.5)
WBC: 6.7 10*3/uL (ref 4.0–10.5)
nRBC: 0 % (ref 0.0–0.2)

## 2018-06-27 LAB — TROPONIN I

## 2018-06-27 MED ORDER — IPRATROPIUM-ALBUTEROL 0.5-2.5 (3) MG/3ML IN SOLN
3.0000 mL | Freq: Once | RESPIRATORY_TRACT | Status: AC
  Administered 2018-06-27: 3 mL via RESPIRATORY_TRACT
  Filled 2018-06-27: qty 3

## 2018-06-27 NOTE — ED Triage Notes (Signed)
Here for intermittent pain across whole chest that started earlier today.  Pt denies it radiating.  Also having nausea and SHOB.  NAD. Color WNL. VSS. Unlabored.

## 2018-06-27 NOTE — ED Notes (Signed)
POCT was Negative

## 2018-06-27 NOTE — ED Provider Notes (Signed)
[facility] Emergency Department Provider Note   ____________________________________________    I have reviewed the triage vital signs and the nursing notes.   HISTORY  Chief Complaint Chest Pain     HPI Shannon [name] is a 33 y.o. female who presents with complaints of chest discomfort.  Patient reports that around 2 PM she felt hot, flushed and lightheaded and shortly thereafter developed a sensation of tightness in the top of her chest.  The symptoms have all primarily resolved however she describes some continued tightness in her chest.  No shortness of breath.  No recent travel.  No calf pain or swelling.  No history of blood clots.  No history of heart disease.   Past Medical History:  Diagnosis Date  . PCOS (polycystic ovarian syndrome)     There are no active problems to display for this patient.   Past Surgical History:  Procedure Laterality Date  . TONSILLECTOMY      Prior to Admission medications   Medication Sig Start Date End Date Taking? Authorizing Provider  ibuprofen (ADVIL,MOTRIN) 400 MG tablet Take 1 tablet (400 mg total) by mouth every 6 (six) hours as needed. 08/26/15   Benjiman CorePickering, Nathan, MD  magic mouthwash SOLN Take 5 mLs by mouth 3 (three) times daily as needed for mouth pain. Patient not taking: Reported on 01/22/2017 10/01/15   Irean HongSung, Jade J, MD     Allergies Clindamycin/lincomycin and Penicillins  Family History  Problem Relation Age of Onset  . Diabetes Maternal Aunt   . Diabetes Paternal Aunt   . Stroke Maternal Grandmother   . Cancer Paternal Grandmother   . Stroke Paternal Grandfather     Social History Social History   Tobacco Use  . Smoking status: Never Smoker  . Smokeless tobacco: Never Used  Substance Use Topics  . Alcohol use: No  . Drug use: No    Review of Systems  Constitutional: No fever/chills Eyes: No visual changes.  ENT: No sore throat. Cardiovascular: As above Respiratory: Denies  shortness of breath. Abdomen: No nausea or vomiting Genitourinary: Negative for dysuria. Musculoskeletal: Negative for back pain. Skin: Negative for rash. Neurological: Negative for headaches   ____________________________________________   PHYSICAL EXAM:  VITAL SIGNS: ED Triage Vitals  Enc Vitals Group     BP 06/27/18 1810 135/73     Pulse Rate 06/27/18 1810 79     Resp 06/27/18 1810 16     Temp 06/27/18 1810 98.6 F (37 C)     Temp Source 06/27/18 1810 Oral     SpO2 06/27/18 1810 100 %     Weight 06/27/18 1810 111.1 kg (245 lb)     Height 06/27/18 1810 1.702 m (5\' 7" )     Head Circumference --      Peak Flow --      Pain Score 06/27/18 1819 7     Pain Loc --      Pain Edu? --      Excl. in GC? --     Constitutional: Alert and oriented.   Nose: No congestion/rhinnorhea. Mouth/Throat: Mucous membranes are moist.    Cardiovascular: Normal rate, regular rhythm. Grossly normal heart sounds.  Good peripheral circulation. Respiratory: Normal respiratory effort.  No retractions. Lungs CTAB. Gastrointestinal: Soft and nontender. No distention.  No CVA tenderness.  Musculoskeletal: .  Warm and well perfused Neurologic:  Normal speech and language. No gross focal neurologic deficits are appreciated.  Skin:  Skin is warm, dry and  intact. No rash noted. Psychiatric: Mood and affect are normal. Speech and behavior are normal.  ____________________________________________   LABS (all labs ordered are listed, but only abnormal results are displayed)  Labs Reviewed  BASIC METABOLIC PANEL - Abnormal; Notable for the following components:      Result Value   Glucose, Bld 101 (*)    Creatinine, Ser 1.02 (*)    All other components within normal limits  CBC  TROPONIN I  TROPONIN I  POC URINE PREG, ED   ____________________________________________  EKG  ED ECG REPORT I, Jene Everyobert Wayland Baik, the attending physician, personally viewed and interpreted this ECG.  Date:  06/27/2018  Rhythm: normal sinus rhythm QRS Axis: normal Intervals: normal ST/T Wave abnormalities: normal Narrative Interpretation: no evidence of acute ischemia  ____________________________________________  RADIOLOGY  Chest x-ray normal ____________________________________________   PROCEDURES  Procedure(s) performed: No  Procedures   Critical Care performed: No ____________________________________________   INITIAL IMPRESSION / ASSESSMENT AND PLAN / ED COURSE  Pertinent labs & imaging results that were available during my care of the patient were reviewed by me and considered in my medical decision making (see chart for details).  Patient well-appearing in no acute distress.  Exam is overall quite reassuring.  Lab work is benign, troponin normal.  EKG unremarkable.  Chest x-ray normal.  Doubt ACS, EKG not consistent with pericarditis, nor myocarditis.  Very low risk for PE or dissection.  Will obtain second troponin, give DuoNeb and reevaluate in case there is a component of bronchospasm   Patient had complete relief of symptoms after breathing treatment.  Second troponin is normal.  At this point patient is appropriate for discharge, outpatient follow-up.  Return precautions discussed    ____________________________________________   FINAL CLINICAL IMPRESSION(S) / ED DIAGNOSES  Final diagnoses:  Atypical chest pain        Note:  This document was prepared using Dragon voice recognition software and may include unintentional dictation errors.   Jene EveryKinner, Tyanne Derocher, MD 06/27/18 2255

## 2019-06-07 ENCOUNTER — Ambulatory Visit: Payer: Medicaid Other | Attending: Oncology

## 2019-09-14 DIAGNOSIS — Z23 Encounter for immunization: Secondary | ICD-10-CM | POA: Diagnosis not present

## 2019-10-12 DIAGNOSIS — Z23 Encounter for immunization: Secondary | ICD-10-CM | POA: Diagnosis not present

## 2019-11-07 ENCOUNTER — Ambulatory Visit: Payer: Medicaid Other | Admitting: Family Medicine

## 2019-11-07 ENCOUNTER — Encounter: Payer: Self-pay | Admitting: Family Medicine

## 2019-11-07 ENCOUNTER — Other Ambulatory Visit: Payer: Self-pay

## 2019-11-07 VITALS — BP 108/72 | HR 95 | Temp 98.0°F | Resp 16 | Ht 67.25 in | Wt 245.2 lb

## 2019-11-07 DIAGNOSIS — E6609 Other obesity due to excess calories: Secondary | ICD-10-CM

## 2019-11-07 DIAGNOSIS — E282 Polycystic ovarian syndrome: Secondary | ICD-10-CM | POA: Diagnosis not present

## 2019-11-07 DIAGNOSIS — Z6838 Body mass index (BMI) 38.0-38.9, adult: Secondary | ICD-10-CM | POA: Diagnosis not present

## 2019-11-07 DIAGNOSIS — N3945 Continuous leakage: Secondary | ICD-10-CM | POA: Diagnosis not present

## 2019-11-07 DIAGNOSIS — R5383 Other fatigue: Secondary | ICD-10-CM

## 2019-11-07 LAB — COMPREHENSIVE METABOLIC PANEL
ALT: 8 U/L (ref 0–35)
AST: 14 U/L (ref 0–37)
Albumin: 4.1 g/dL (ref 3.5–5.2)
Alkaline Phosphatase: 87 U/L (ref 39–117)
BUN: 15 mg/dL (ref 6–23)
CO2: 32 mEq/L (ref 19–32)
Calcium: 8.8 mg/dL (ref 8.4–10.5)
Chloride: 102 mEq/L (ref 96–112)
Creatinine, Ser: 1.06 mg/dL (ref 0.40–1.20)
GFR: 72.25 mL/min (ref >60.00)
Glucose, Bld: 99 mg/dL (ref 70–99)
Potassium: 4.3 mEq/L (ref 3.5–5.1)
Sodium: 138 mEq/L (ref 135–145)
Total Bilirubin: 0.3 mg/dL (ref 0.2–1.2)
Total Protein: 7 g/dL (ref 6.0–8.3)

## 2019-11-07 LAB — LIPID PANEL
Cholesterol: 184 mg/dL (ref 0–200)
HDL: 46.3 mg/dL (ref >39.00)
LDL Cholesterol: 123 mg/dL — ABNORMAL HIGH (ref 0–99)
NonHDL: 137.75
Total CHOL/HDL Ratio: 4
Triglycerides: 74 mg/dL (ref 0.0–149.0)
VLDL: 14.8 mg/dL (ref 0.0–40.0)

## 2019-11-07 LAB — CBC
HCT: 37.3 % (ref 36.0–46.0)
Hemoglobin: 12 g/dL (ref 12.0–15.0)
MCHC: 32.3 g/dL (ref 30.0–36.0)
MCV: 85.1 fl (ref 78.0–100.0)
Platelets: 293 10*3/uL (ref 150.0–400.0)
RBC: 4.39 Mil/uL (ref 3.87–5.11)
RDW: 13.9 % (ref 11.5–15.5)
WBC: 6.2 10*3/uL (ref 4.0–10.5)

## 2019-11-07 NOTE — Assessment & Plan Note (Signed)
Will get blood work to evaluate.

## 2019-11-07 NOTE — Patient Instructions (Addendum)
Constipation   Constipation is a common issue. Often it is related to diet and occasionally medications.   What you can do to treat your symptoms 1) Fiber -- Eat more fiber rich foods: beans, broccoli, berries, avocados, popcorn, pear/apple, green peas, turnip greens, brussels sprouts, whole grains (barley, bran, quinoa, oatmeal) -- Take a Fiber supplement: Psyllium (Metamucil)  -- Could also eat Prunes daily  2) Hydration  -- Drink more water: Try to drink 64 oz of water per day  3) Exercise -- Moderate exercise (walking, jogging, biking) for 30 minutes, 5 days a week  4) Dedicate time for Bowel movements - do not delay  5) Stool Softener  - Docusate Sodium (Colace) 100 mg daily or twice daily as needed   If 4-6 weeks have passed and the above has not helped then start the following 6) Laxatives -- Polyethylene Glycol (Miralax) - begin with once daily. After a few days can increase to twice daily Or -- Magnesium Citrate -- Common side effect is nausea and diarrhea -- can try if still not improved  If still not improved after 4-6 weeks 7) Stimulant Laxatives: can also be an option if doing above treatment and no bowel movement for 3 days --- Bisacodyl (Dulcolax) 5-15 mg daily --- Sennosides (Senokot)  Treating chronic constipation is often about finding the right amount of medication and fiber to keep you regular and comfortable. For some people that may be daily metamucil and colace every other day. For others it may be Metamucil and colace twice daily and Miralax 3 times a week. The goal is to go slow and listen to your body. And normal can be anywhere from 2-3 soft bowel movements a day to 1 bowel movement every 2-3 days.    #Referral I have placed a referral to a specialist for you. You should receive a phone call from the specialty office. Make sure your voicemail is not full and that if you are able to answer your phone to unknown or new numbers.   It may take up to 2  weeks to hear about the referral. If you do not hear anything in 2 weeks, please call our office and ask to speak with the referral coordinator.   

## 2019-11-07 NOTE — Assessment & Plan Note (Signed)
Encourage exercise and healthy diet. Blood work for risk factors.

## 2019-11-07 NOTE — Assessment & Plan Note (Signed)
Pt notes leakage of urine throughout the day w/o urgency or other trigger. Worse following difficult BM (chronic constipation). Also with symptoms with sneezing. No difficulty with bladder emptying and notes vaginal fullness sensation as well. Concern for pelvic floor weakness with stress incontinence as well as possible prolapse vs overflow issues. Will treat constipation. Discussed urology eval vs pelvic floor PT. She would like to start with pelvic floor PT. Urology if no improvement.

## 2019-11-07 NOTE — Progress Notes (Signed)
Subjective:     Shannon [name] is a 34 y.o. female presenting for Establish Care (GYN with [facility], no PCP) and Fatigue (discuss iron levels.)     HPI   #Fatigue - hx of low iron - noticed this in January - was tired - no energy - no loss of interest in things - but fatigue slowed her down - periods: PCOS does not have regular cycles - Sleep: irregular - because son has insomnia issues, gets about 5 hours - but not changed with fatigue - body fatigue, not sleepiness - hair is thinning - BM have been irregular  #Urinary incontinence - occurs throughout the day - use to be with sneezing - will get small volume leakage - no sensation prior to leak - does feel she empties her bladder - symptoms x 1.5 years - wearing a pad all the time which she changes 3 times a day with bathroom breaks - no urgency - no dysuria - still gets leakage with sneezing still - endorses some constipation - taking stool softeners and also has hemorrhoids - endorses a pressure sensation or fullness sensation occasionally    Review of Systems   Social History   Tobacco Use  Smoking Status Never Smoker  Smokeless Tobacco Never Used        Objective:    BP Readings from Last 3 Encounters:  11/07/19 108/72  06/27/18 134/75  01/22/17 112/74   Wt Readings from Last 3 Encounters:  11/07/19 245 lb 4 oz (111.2 kg)  06/27/18 245 lb (111.1 kg)  01/22/17 225 lb (102.1 kg)    BP 108/72   Pulse 95   Temp 98 F (36.7 C)   Resp 16   Ht 5' 7.25" (1.708 m)   Wt 245 lb 4 oz (111.2 kg)   LMP 09/16/2019   SpO2 96%   BMI 38.13 kg/m    Physical Exam Constitutional:      General: She is not in acute distress.    Appearance: She is well-developed. She is not diaphoretic.  HENT:     Right Ear: External ear normal.     Left Ear: External ear normal.  Eyes:     Conjunctiva/sclera: Conjunctivae normal.  Neck:     Thyroid: No thyroid mass, thyromegaly or thyroid tenderness.    Cardiovascular:     Rate and Rhythm: Normal rate and regular rhythm.     Heart sounds: No murmur.  Pulmonary:     Effort: Pulmonary effort is normal. No respiratory distress.     Breath sounds: Normal breath sounds. No wheezing.  Musculoskeletal:     Cervical back: Neck supple.  Skin:    General: Skin is warm and dry.     Capillary Refill: Capillary refill takes less than 2 seconds.  Neurological:     Mental Status: She is alert. Mental status is at baseline.  Psychiatric:        Mood and Affect: Mood normal.        Behavior: Behavior normal.           Assessment & Plan:   Problem List Items Addressed This Visit      Endocrine   PCOS (polycystic ovarian syndrome)   Relevant Orders   Ferritin   CBC     Other   Continuous leakage of urine    Pt notes leakage of urine throughout the day w/o urgency or other trigger. Worse following difficult BM (chronic constipation). Also with symptoms with sneezing.  No difficulty with bladder emptying and notes vaginal fullness sensation as well. Concern for pelvic floor weakness with stress incontinence as well as possible prolapse vs overflow issues. Will treat constipation. Discussed urology eval vs pelvic floor PT. She would like to start with pelvic floor PT. Urology if no improvement.       Relevant Orders   Ambulatory referral to Physical Therapy   Other fatigue - Primary    Will get blood work to evaluate.       Relevant Orders   Vitamin D, 25-hydroxy   TSH   Ferritin   CBC   Class 2 obesity due to excess calories without serious comorbidity with body mass index (BMI) of 38.0 to 38.9 in adult    Encourage exercise and healthy diet. Blood work for risk factors.       Relevant Orders   Comprehensive metabolic panel   Lipid panel       Return in about 3 months (around 02/07/2020).  Lynnda Child, MD

## 2019-11-08 ENCOUNTER — Other Ambulatory Visit: Payer: Self-pay | Admitting: Family Medicine

## 2019-11-08 ENCOUNTER — Encounter: Payer: Self-pay | Admitting: Family Medicine

## 2019-11-08 DIAGNOSIS — E611 Iron deficiency: Secondary | ICD-10-CM

## 2019-11-08 DIAGNOSIS — E559 Vitamin D deficiency, unspecified: Secondary | ICD-10-CM

## 2019-11-08 LAB — VITAMIN D 25 HYDROXY (VIT D DEFICIENCY, FRACTURES): VITD: 12.63 ng/mL — ABNORMAL LOW (ref 30.00–100.00)

## 2019-11-08 LAB — FERRITIN: Ferritin: 33.7 ng/mL (ref 10.0–291.0)

## 2019-11-08 LAB — TSH: TSH: 1.55 u[IU]/mL (ref 0.35–4.50)

## 2019-11-08 MED ORDER — IRON 325 (65 FE) MG PO TABS: 1.0000 | ORAL_TABLET | Freq: Every day | ORAL | 1 refills | Status: AC

## 2019-11-08 MED ORDER — VITAMIN D (ERGOCALCIFEROL) 1.25 MG (50000 UNIT) PO CAPS: 50000.0000 [IU] | ORAL_CAPSULE | ORAL | 1 refills | Status: DC

## 2019-11-08 NOTE — Progress Notes (Signed)
Problem List Items Addressed This Visit    None    Visit Diagnoses    Vitamin D deficiency    -  Primary   Relevant Medications   Vitamin D, Ergocalciferol, (DRISDOL) 1.25 MG (50000 UNIT) CAPS capsule   Low iron       Relevant Medications   Ferrous Sulfate (IRON) 325 (65 Fe) MG TABS

## 2019-11-14 ENCOUNTER — Encounter: Payer: Self-pay | Admitting: Physical Therapy

## 2019-11-14 ENCOUNTER — Ambulatory Visit: Payer: Medicaid Other | Attending: Family Medicine | Admitting: Physical Therapy

## 2019-11-14 ENCOUNTER — Other Ambulatory Visit: Payer: Self-pay

## 2019-11-14 DIAGNOSIS — R278 Other lack of coordination: Secondary | ICD-10-CM | POA: Diagnosis not present

## 2019-11-14 DIAGNOSIS — M6281 Muscle weakness (generalized): Secondary | ICD-10-CM | POA: Diagnosis present

## 2019-11-14 DIAGNOSIS — R32 Unspecified urinary incontinence: Secondary | ICD-10-CM | POA: Diagnosis present

## 2019-11-14 NOTE — Therapy (Signed)
[facility] [address] [place], [place], [address] Phone: [phone]   Fax:  [phone]  Physical Therapy Evaluation  Patient Details  Name: Shannon [name] MRN: [id number] Date of Birth: 01/03/1986 Referring Provider (PT): Waunita Schooner MD   Encounter Date: 11/14/2019  PT End of Session - 11/14/19 1612    Visit Number  1    Number of Visits  12    Date for PT Re-Evaluation  02/06/20    PT Start Time  1610    PT Stop Time  1700    PT Time Calculation (min)  50 min    Activity Tolerance  Patient tolerated treatment well    Behavior During Therapy  [facility] for tasks assessed/performed       Past Medical History:  Diagnosis Date  . Asthma   . Depression   . Hypertension    gestational  . Migraine   . PCOS (polycystic ovarian syndrome)   . PCOS (polycystic ovarian syndrome)   . Urinary incontinence     Past Surgical History:  Procedure Laterality Date  . TONSILLECTOMY    . TONSILLECTOMY  2007    There were no vitals filed for this visit.       [facility] PT Assessment - 11/14/19 1607      Assessment   Medical Diagnosis  Urinary Incontinence    Referring Provider (PT)  Waunita Schooner MD    Onset Date/Surgical Date  05/17/19    Hand Dominance  Right    Next MD Visit  01/2020    Prior Therapy  none for this dx      Precautions   Precautions  None      Balance Screen   Has the patient fallen in the past 6 months  No       PELVIC HEALTH PHYSICAL THERAPY EVALUATION  SCREENING Red Flags: None Have you had any night sweats? Unexplained weight loss? Saddle anesthesia? Unexplained changes in bowel or bladder habits?  Precautions: None  SUBJECTIVE  Chief Complaint: Patient states that since the birth of her child in 2010 she has had infrequent minimal loss with coughing/laughing/sneezing. In the past 6 months, she notes it's happening with more frequency. Patient clarifies that she continues to have the loss of  urine with sneezing, but not coughing and laughing anymore. She adds that almost every time she goes to the bathroom she will notice that she has leaked some urine. Patient notes she sometimes feels a heaviness in the pelvis; she adds that when she has a period her vagina just feels tired. Patient denies any bulging or palpation of tissue at the entrance when performing hygiene.  Pertinent History:  Falls Negative.  Scoliosis Negative. Pulmonary disease/dysfunction Positive for asthma. Surgical history: Positive for tonsillectomy.   Recent Procedures/Tests/Findings: none  Obstetrical History: G1P1 Deliveries: vaginal Tearing/Episiotomy: episiotomy Birthing position: back  Gynecological History: Hysterectomy: No  Endometriosis: Negative Last Menstrual Period: end of March/beginning of April; no consistent cycle Pain with exam: No   Urinary History: Incontinence: Positive. Onset: 2010; 2020 Triggers: sneezing; transfers; unaware of other triggers Amount: Min/Mod. 3 panty liners/day. Fluid Intake: 3x 16.9 oz H20, no caffeinated, 24 oz juices/sodas Nocturia: 0-1x/night Frequency of urination: 9-12x/day; every 2 hours Pain with urination: Negative Difficulty initiating urination: Negative Frequent UTI: Negative. Patient notes that she has had UTIs and been unaware of the infection.  Gastrointestinal History: [place] Stool Chart: Type 2, occasional 1. with Dulcolax/[name] Type 4.  Frequency of  BMs: 1x/day; hx of 1-2x/day. Pain with defecation: Positive for occasional with blood in stool. Straining with defecation: Positive for 2 year hx. Positive for hemorrhoids for 3 years.  Incontinence: Negative.  Sexual activity/pain: Pain with intercourse: Negative.   Location of pain: abdominal, rectal Current pain:  0/10  Max pain:  6/10 rectal; 5/10 abdominal Least pain:  0/10 Pain quality: pain quality: throbbing; cramping Radiating pain: Yes to lumbar spine   Patient assessment  of present state: I don't know, but I know my mom had to have a sling because therapy didn't work for her.  Current activities:  Good sleep hygiene; notes no activities  Patient Goals:  I won't have the leakage anymore or the stress of the leakage anymore.   Patient perception of overall health: Good  OBJECTIVE  Mental Status Patient is oriented to person, place and time.  Recent memory is intact.  Remote memory is intact.  Attention span and concentration are intact.  Expressive speech is intact.  Patient's fund of knowledge is within normal limits for educational level.  POSTURE/OBSERVATIONS:  Lumbar lordosis: hyperlordotic Iliac crest height: equal bilaterally Lumbar lateral shift: negative Pelvic obliquity: negative Leg length discrepancy: negative  GAIT: Trendelenburg R: Positive L: Negative  RANGE OF MOTION:    LEFT RIGHT  Lumbar forward flexion (65):  WNL    Lumbar extension (30): WNL    Lumbar lateral flexion (25):  WNL WNL  Thoracic and Lumbar rotation (30 degrees):    WNL WNL  Hip Flexion (0-125):   WNL WNL  Hip IR (0-45):  WNL WNL  Hip ER (0-45):  WNL WNL  Hip Abduction (0-40):  WNL WNL  Hip extension (0-15):  WNL WNL   SENSATION: Grossly intact to light touch bilateral LEs as determined by testing dermatomes L2-S2 Proprioception and hot/cold testing deferred on this date  STRENGTH: MMT   RLE LLE  Hip Flexion 5 5  Hip Extension 4 4  Hip Abduction (seated) 4 4  Hip Adduction  5 5  Hip ER  5 5  Hip IR  5 5  Knee Extension 5 5  Knee Flexion 5 5  Dorsiflexion  5 5  Plantarflexion (seated) 5 5   ABDOMINAL:  Palpation: no TTP Diastasis: 2 finger width at umbilicus Rib flare: none noted  SPECIAL TESTS: Stork/March (SP 93): R: Negative L: Negative  PHYSICAL PERFORMANCE MEASURES: STS: WNL  EXTERNAL PELVIC EXAM: deferred 2/2 to time constraints Palpation: Breath coordination: Cued Lengthen: Cued Contraction: Cough:  INTERNAL VAGINAL  EXAM: deferred 2/2 to time constraints Introitus Appears:  Skin integrity:  Scar mobility: Strength (PERF):  Symmetry: Palpation: Prolapse:   INTERNAL RECTAL EXAM: deferred 2/2 to time constraints Strength (PERF): Symmetry: Palpation: Prolapse:   OUTCOME MEASURES: FOTO (Urinary 47; Constipation 55)   ASSESSMENT Patient is a 34 year old presenting to clinic with chief complaints of urinary incontinence and constipation. Upon examination, patient demonstrates deficits in PFM strength, PFM coordination, posture, balance, B hip abductor strength, and IAP management as evidenced by incontinence with sneezing and without awareness, straining with BM, increased lumbar lordosis, SLS < 3 sec without UE support, positive B Trendelenberg during gait, and valsalva maneuver with BM. Patient's responses on FOTO outcome measures (Urinary 47; Constipation 55) indicate moderate functional limitations/disability/distress. Patient's progress may be limited due to comorbidities; however, patient's motivation is advantageous. Patient was able to achieve basic understanding of PFM functions during today's evaluation and responded positively to educational interventions. Patient will benefit from continued skilled therapeutic  intervention to address deficits in PFM strength, PFM coordination, posture, balance, B hip abductor strength, and IAP management in order to increase function and improve overall QOL.  EDUCATION Patient educated on prognosis, POC, and provided with HEP including: bladder diary; toileting posture. Patient articulated understanding and returned demonstration. Patient will benefit from further education in order to maximize compliance and understanding for long-term therapeutic gains.  TREATMENT Neuromuscular Re-education: Patient educated on primary functions of the pelvic floor including: posture/balance, sexual pleasure, storage and elimination of waste from the body, abdominal cavity  closure, and breath coordination.     Objective measurements completed on examination: See above findings.                   PT Long Term Goals - 11/14/19 1729      PT LONG TERM GOAL #1   Title  Patient will demonstrate independence with HEP in order to maximize therapeutic gains and improve carryover from physical therapy sessions to ADLs in the home and community.    Baseline  IE: not demonstrated    Time  12    Period  Weeks    Status  New    Target Date  02/06/20      PT LONG TERM GOAL #2   Title  Patient will demonstrate circumferential and sequential contraction of >4/5 MMT, > 6 sec hold x10 and 5 consecutive quick flicks with </= 10 min rest between testing bouts, and relaxation of the PFM coordinated with breath for improved management of intra-abdominal pressure and normal bowel and bladder function without the presence of pain nor incontinence in order to improve participation at home and in the community.    Baseline  IE: not demonstrated    Time  12    Period  Weeks    Status  New    Target Date  02/06/20      PT LONG TERM GOAL #3   Title  Patient will demonstrate improved function as evidenced by a score of 54 on FOTO measure for full participation in activities at home and in the community.    Baseline  IE: Urinary 47, Constipation 55    Time  12    Period  Weeks    Status  New    Target Date  02/06/20      PT LONG TERM GOAL #4   Title  Patient will report BMs classified as Type 3-Type 4 on the [facility] Stool Chart greater than 50% of the time without stool softener/laxative to demonstrate improved motility and stool bulking in order to decrease fecal distress and improve overall QOL.    Baseline  IE: 0% without stool softener    Time  12    Period  Weeks    Status  New    Target Date  02/06/20      PT LONG TERM GOAL #5   Title  Patient will demonstrate improved toileting posture with knees higher than hips and feet supported, and will report  straining <3x/week with bowel movements in order to decrease incidence of constipation/hemorrhoids/mismanagement of intra-abdominal pressure and improve overall QOL.    Baseline  IE: not demonstrated    Time  12    Period  Weeks    Status  New    Target Date  02/06/20             Plan - 11/14/19 1710    Clinical Impression Statement  Patient is a 34 year old presenting to  clinic with chief complaints of urinary incontinence and constipation. Upon examination, patient demonstrates deficits in PFM strength, PFM coordination, posture, balance, B hip abductor strength, and IAP management as evidenced by incontinence with sneezing and without awareness, straining with BM, increased lumbar lordosis, SLS < 3 sec without UE support, positive B Trendelenberg during gait, and valsalva maneuver with BM. Patient's responses on FOTO outcome measures (Urinary 47; Constipation 55) indicate moderate functional limitations/disability/distress. Patient's progress may be limited due to comorbidities; however, patient's motivation is advantageous. Patient was able to achieve basic understanding of PFM functions during today's evaluation and responded positively to educational interventions. Patient will benefit from continued skilled therapeutic intervention to address deficits in PFM strength, PFM coordination, posture, balance, B hip abductor strength, and IAP management in order to increase function and improve overall QOL.    Personal Factors and Comorbidities  Age;Sex;Comorbidity 3+;Time since onset of injury/illness/exacerbation;Fitness;Behavior Pattern    Comorbidities  HTN, Asthma, Migraine, PCOS, depression    Examination-Activity Limitations  Transfers;Continence;Toileting;Stand;Bend;Lift;Sleep;Reach Overhead;Carry    Examination-Participation Restrictions  Yard Work;Cleaning;Laundry;Community Activity;Meal Prep;Driving;Shop    Stability/Clinical Decision Making  Evolving/Moderate complexity    Clinical  Decision Making  Moderate    Rehab Potential  Good    PT Frequency  1x / week    PT Duration  12 weeks    PT Treatment/Interventions  Moist Heat;Cryotherapy;Electrical Stimulation;ADLs/Self Care Home Management;Gait training;Stair training;Functional mobility training;Therapeutic activities;Scar mobilization;Taping;Dry needling;Passive range of motion;Joint Manipulations;Spinal Manipulations;Manual techniques;Patient/family education;Therapeutic exercise;Balance training;Neuromuscular re-education    PT Next Visit Plan  External PFM assessment; colon massage    PT Home Exercise Plan  toilet posture; bladder diary    Consulted and Agree with Plan of Care  Patient       Patient will benefit from skilled therapeutic intervention in order to improve the following deficits and impairments:  Abnormal gait, Decreased balance, Postural dysfunction, Improper body mechanics, Decreased range of motion, Decreased strength, Impaired flexibility, Decreased coordination, Decreased activity tolerance, Impaired sensation, Hypomobility, Decreased mobility, Decreased endurance  Visit Diagnosis: Other lack of coordination  Muscle weakness (generalized)  Urinary incontinence, unspecified type     Problem List Patient Active Problem List   Diagnosis Date Noted  . PCOS (polycystic ovarian syndrome) 11/07/2019  . Continuous leakage of urine 11/07/2019  . Other fatigue 11/07/2019  . Class 2 obesity due to excess calories without serious comorbidity with body mass index (BMI) of 38.0 to 38.9 in adult 11/07/2019   Sheria Lang PT, DPT [phone] 11/14/2019, 5:32 PM  [facility] [address] [place], [place], [address] Phone: [phone]   Fax:  [phone]  Name: Shannon [name] MRN: [id number] Date of Birth: 02-07-1986

## 2019-11-16 ENCOUNTER — Encounter: Payer: Medicaid Other | Admitting: Physical Therapy

## 2019-11-23 ENCOUNTER — Ambulatory Visit: Payer: Medicaid Other | Attending: Family Medicine | Admitting: Physical Therapy

## 2019-11-23 ENCOUNTER — Encounter: Payer: Self-pay | Admitting: Physical Therapy

## 2019-11-23 ENCOUNTER — Other Ambulatory Visit: Payer: Self-pay

## 2019-11-23 DIAGNOSIS — M6281 Muscle weakness (generalized): Secondary | ICD-10-CM | POA: Insufficient documentation

## 2019-11-23 DIAGNOSIS — R278 Other lack of coordination: Secondary | ICD-10-CM | POA: Insufficient documentation

## 2019-11-23 DIAGNOSIS — R32 Unspecified urinary incontinence: Secondary | ICD-10-CM | POA: Insufficient documentation

## 2019-11-23 NOTE — Therapy (Signed)
[facility] [address] [place], [place], [address] Phone: [phone]   Fax:  [phone]  Physical Therapy Treatment  Patient Details  Name: Shannon [name] MRN: [id number] Date of Birth: 1985/10/13 Referring Provider (PT): Waunita Schooner MD   Encounter Date: 11/23/2019  PT End of Session - 11/23/19 1611    Visit Number  2    Number of Visits  12    Date for PT Re-Evaluation  02/06/20    PT Start Time  1602    PT Stop Time  1658    PT Time Calculation (min)  56 min    Activity Tolerance  Patient tolerated treatment well    Behavior During Therapy  [facility] for tasks assessed/performed       Past Medical History:  Diagnosis Date  . Asthma   . Depression   . Hypertension    gestational  . Migraine   . PCOS (polycystic ovarian syndrome)   . PCOS (polycystic ovarian syndrome)   . Urinary incontinence     Past Surgical History:  Procedure Laterality Date  . TONSILLECTOMY    . TONSILLECTOMY  2007    There were no vitals filed for this visit.  Subjective Assessment - 11/23/19 1606    Subjective  Patient presents to clinic and notes that the squatty potty/toileting posture adjustment has helped a lot: decreased straining and decreased duration of BMs. Patient also offers her bladder diary with good indication of ability to store, but does add that since switching to urinary incontinence pads, she has a harder time noticing if she leaked due to their more effective absorbency. Patient did have one BM with high pain intensity (5/10) that lasted for hours after.    Currently in Pain?  No/denies      TREATMENT  Pre-treatment assessment: EXTERNAL PELVIC EXAM: Patient educated on the purpose of the pelvic exam and articulated understanding; patient consented to the exam verbally. Palpation: no TTP; good tissue extensibility throughout Breath coordination: present Cued Lengthen: able to lengthen appropriately with abdominal  compensations Cued Contraction: glute and hip adductor compensations; when cued to 50% compensations still present Cough: paradoxical  Manual Therapy: STM to abdomen/colon for improved gut motility and decreased pain/discomfort with BMs  Neuromuscular Re-education: Supine hooklying diaphragmatic breathing with VCs and TCs for downregulation of the nervous system and improved management of IAP Supine hooklying, PFM breath coordination. VCs and TCs to decrease compensatory patterns and encourage optimal coordination of the PFM. Sidelying, PFM breath coordination with autonomous TCs. VCs to decrease compensatory patterns and encourage optimal coordination of the PFM. Seated, PFM breath coordination with washcloth roll TCs. VCs to decrease compensatory patterns and encourage optimal coordination of the PFM. Patient education on bowel regulation techniques including: regular food consumption; warm beverage first in the AM; gentle exercise; fiber consumption. Patient education on proprioception/motor control development strategies for PFM.   Patient educated throughout session on appropriate technique and form using multi-modal cueing, HEP, and activity modification. Patient articulated understanding and returned demonstration.  Patient Response to interventions: Comfortable with HEP.   ASSESSMENT Patient presents to clinic with excellent motivation to participate in therapy. Patient demonstrates deficits in PFM strength, PFM coordination, posture, balance, B hip abductor strength, and IAP management. Patient able to achieve basic awareness for PFM coordination and understanding of colon massage during today's session and responded positively to active and educational interventions. Patient will benefit from continued skilled therapeutic intervention to address remaining deficits in PFM strength, PFM  coordination, posture, balance, B hip abductor strength, and IAP management in order to increase  function and improve overall QOL.    PT Long Term Goals - 11/14/19 1729      PT LONG TERM GOAL #1   Title  Patient will demonstrate independence with HEP in order to maximize therapeutic gains and improve carryover from physical therapy sessions to ADLs in the home and community.    Baseline  IE: not demonstrated    Time  12    Period  Weeks    Status  New    Target Date  02/06/20      PT LONG TERM GOAL #2   Title  Patient will demonstrate circumferential and sequential contraction of >4/5 MMT, > 6 sec hold x10 and 5 consecutive quick flicks with </= 10 min rest between testing bouts, and relaxation of the PFM coordinated with breath for improved management of intra-abdominal pressure and normal bowel and bladder function without the presence of pain nor incontinence in order to improve participation at home and in the community.    Baseline  IE: not demonstrated    Time  12    Period  Weeks    Status  New    Target Date  02/06/20      PT LONG TERM GOAL #3   Title  Patient will demonstrate improved function as evidenced by a score of 54 on FOTO measure for full participation in activities at home and in the community.    Baseline  IE: Urinary 47, Constipation 55    Time  12    Period  Weeks    Status  New    Target Date  02/06/20      PT LONG TERM GOAL #4   Title  Patient will report BMs classified as Type 3-Type 4 on the [facility] Stool Chart greater than 50% of the time without stool softener/laxative to demonstrate improved motility and stool bulking in order to decrease fecal distress and improve overall QOL.    Baseline  IE: 0% without stool softener    Time  12    Period  Weeks    Status  New    Target Date  02/06/20      PT LONG TERM GOAL #5   Title  Patient will demonstrate improved toileting posture with knees higher than hips and feet supported, and will report straining <3x/week with bowel movements in order to decrease incidence of  constipation/hemorrhoids/mismanagement of intra-abdominal pressure and improve overall QOL.    Baseline  IE: not demonstrated    Time  12    Period  Weeks    Status  New    Target Date  02/06/20            Plan - 11/23/19 1612    Clinical Impression Statement  Patient presents to clinic with excellent motivation to participate in therapy. Patient demonstrates deficits in PFM strength, PFM coordination, posture, balance, B hip abductor strength, and IAP management. Patient able to achieve basic awareness for PFM coordination and understanding of colon massage during today's session and responded positively to active and educational interventions. Patient will benefit from continued skilled therapeutic intervention to address remaining deficits in PFM strength, PFM coordination, posture, balance, B hip abductor strength, and IAP management in order to increase function and improve overall QOL.    Personal Factors and Comorbidities  Age;Sex;Comorbidity 3+;Time since onset of injury/illness/exacerbation;Fitness;Behavior Pattern    Comorbidities  HTN, Asthma, Migraine, PCOS, depression  Examination-Activity Limitations  Transfers;Continence;Toileting;Stand;Bend;Lift;Sleep;Reach Overhead;Carry    Examination-Participation Restrictions  Yard Work;Cleaning;Laundry;Community Activity;Meal Prep;Driving;Shop    Stability/Clinical Decision Making  Evolving/Moderate complexity    Rehab Potential  Good    PT Frequency  1x / week    PT Duration  12 weeks    PT Treatment/Interventions  Moist Heat;Cryotherapy;Electrical Stimulation;ADLs/Self Care Home Management;Gait training;Stair training;Functional mobility training;Therapeutic activities;Scar mobilization;Taping;Dry needling;Passive range of motion;Joint Manipulations;Spinal Manipulations;Manual techniques;Patient/family education;Therapeutic exercise;Balance training;Neuromuscular re-education    PT Next Visit Plan  External PFM assessment; colon  massage    PT Home Exercise Plan  toilet posture; bladder diary    Consulted and Agree with Plan of Care  Patient       Patient will benefit from skilled therapeutic intervention in order to improve the following deficits and impairments:  Abnormal gait, Decreased balance, Postural dysfunction, Improper body mechanics, Decreased range of motion, Decreased strength, Impaired flexibility, Decreased coordination, Decreased activity tolerance, Impaired sensation, Hypomobility, Decreased mobility, Decreased endurance  Visit Diagnosis: Other lack of coordination  Muscle weakness (generalized)  Urinary incontinence, unspecified type     Problem List Patient Active Problem List   Diagnosis Date Noted  . PCOS (polycystic ovarian syndrome) 11/07/2019  . Continuous leakage of urine 11/07/2019  . Other fatigue 11/07/2019  . Class 2 obesity due to excess calories without serious comorbidity with body mass index (BMI) of 38.0 to 38.9 in adult 11/07/2019   Sheria Lang PT, DPT [phone] 11/23/2019, 5:17 PM  [place] [facility] [address] [place], [place], [address] Phone: [phone]   Fax:  [phone]  Name: Shannon [name] MRN: [id number] Date of Birth: December 30, 1985

## 2019-11-28 ENCOUNTER — Other Ambulatory Visit: Payer: Self-pay

## 2019-11-28 ENCOUNTER — Ambulatory Visit: Payer: Medicaid Other | Admitting: Physical Therapy

## 2019-11-28 ENCOUNTER — Encounter: Payer: Self-pay | Admitting: Physical Therapy

## 2019-11-28 DIAGNOSIS — R32 Unspecified urinary incontinence: Secondary | ICD-10-CM | POA: Diagnosis present

## 2019-11-28 DIAGNOSIS — M6281 Muscle weakness (generalized): Secondary | ICD-10-CM

## 2019-11-28 DIAGNOSIS — R278 Other lack of coordination: Secondary | ICD-10-CM

## 2019-11-28 NOTE — Therapy (Signed)
[facility] [address] [place], [place], [address] Phone: [phone]   Fax:  [phone]  Physical Therapy Treatment  Patient Details  Name: Shannon [name] MRN: [id number] Date of Birth: 15-Nov-1985 Referring Provider (PT): Waunita Schooner MD   Encounter Date: 11/28/2019  PT End of Session - 11/28/19 1616    Visit Number  3    Number of Visits  12    Date for PT Re-Evaluation  02/06/20    PT Start Time  4970    PT Stop Time  1650    PT Time Calculation (min)  40 min    Activity Tolerance  Patient tolerated treatment well    Behavior During Therapy  [facility] for tasks assessed/performed       Past Medical History:  Diagnosis Date  . Asthma   . Depression   . Hypertension    gestational  . Migraine   . PCOS (polycystic ovarian syndrome)   . PCOS (polycystic ovarian syndrome)   . Urinary incontinence     Past Surgical History:  Procedure Laterality Date  . TONSILLECTOMY    . TONSILLECTOMY  2007    There were no vitals filed for this visit.  Subjective Assessment - 11/28/19 1611    Subjective  Patient states that the colon massage worked very well and she was able to incorporate it into her bedtime routine and foudn herself emtpying more regularly in the morning. Patient notes that she still has a little leakage and has been doing her kegels.    Currently in Pain?  No/denies      TREATMENT  Neuromuscular Re-education: Seated diaphragmatic breathing with VCs and TCs for downregulation of the nervous system and improved management of IAP Standing Pilates Postural Control with PFM contraction Hug a Tree, RTB Serve a Tray, RTB Standing TrA activation with UE press and PFM contraction Patient education on stool softener taper with continued behavioral changes for ease of BM. Patient education on positions and times to strengthen PFM and when not to (during bowel/bladder emptying).   Patient educated throughout session on  appropriate technique and form using multi-modal cueing, HEP, and activity modification. Patient articulated understanding and returned demonstration.  Patient Response to interventions: Comfortable with HEP.   ASSESSMENT Patient presents to clinic with excellent motivation to participate in therapy. Patient demonstrates deficits in PFM strength, PFM coordination, posture, balance, B hip abductor strength, and IAP management. Patient able to achieve PFM activation in seated and standing positions with core challenge for proprioceptive feedback during today's session and responded positively to active and educational interventions. Patient will benefit from continued skilled therapeutic intervention to address remaining deficits in PFM strength, PFM coordination, posture, balance, B hip abductor strength, and IAP management in order to increase function and improve overall QOL.     PT Long Term Goals - 11/14/19 1729      PT LONG TERM GOAL #1   Title  Patient will demonstrate independence with HEP in order to maximize therapeutic gains and improve carryover from physical therapy sessions to ADLs in the home and community.    Baseline  IE: not demonstrated    Time  12    Period  Weeks    Status  New    Target Date  02/06/20      PT LONG TERM GOAL #2   Title  Patient will demonstrate circumferential and sequential contraction of >4/5 MMT, > 6 sec hold x10 and 5 consecutive quick flicks  with </= 10 min rest between testing bouts, and relaxation of the PFM coordinated with breath for improved management of intra-abdominal pressure and normal bowel and bladder function without the presence of pain nor incontinence in order to improve participation at home and in the community.    Baseline  IE: not demonstrated    Time  12    Period  Weeks    Status  New    Target Date  02/06/20      PT LONG TERM GOAL #3   Title  Patient will demonstrate improved function as evidenced by a score of 54 on FOTO  measure for full participation in activities at home and in the community.    Baseline  IE: Urinary 47, Constipation 55    Time  12    Period  Weeks    Status  New    Target Date  02/06/20      PT LONG TERM GOAL #4   Title  Patient will report BMs classified as Type 3-Type 4 on the [facility] Stool Chart greater than 50% of the time without stool softener/laxative to demonstrate improved motility and stool bulking in order to decrease fecal distress and improve overall QOL.    Baseline  IE: 0% without stool softener    Time  12    Period  Weeks    Status  New    Target Date  02/06/20      PT LONG TERM GOAL #5   Title  Patient will demonstrate improved toileting posture with knees higher than hips and feet supported, and will report straining <3x/week with bowel movements in order to decrease incidence of constipation/hemorrhoids/mismanagement of intra-abdominal pressure and improve overall QOL.    Baseline  IE: not demonstrated    Time  12    Period  Weeks    Status  New    Target Date  02/06/20            Plan - 11/28/19 1616    Clinical Impression Statement  Patient presents to clinic with excellent motivation to participate in therapy. Patient demonstrates deficits in PFM strength, PFM coordination, posture, balance, B hip abductor strength, and IAP management. Patient able to achieve PFM activation in seated and standing positions with core challenge for proprioceptive feedback during today's session and responded positively to active and educational interventions. Patient will benefit from continued skilled therapeutic intervention to address remaining deficits in PFM strength, PFM coordination, posture, balance, B hip abductor strength, and IAP management in order to increase function and improve overall QOL.    Personal Factors and Comorbidities  Age;Sex;Comorbidity 3+;Time since onset of injury/illness/exacerbation;Fitness;Behavior Pattern    Comorbidities  HTN, Asthma,  Migraine, PCOS, depression    Examination-Activity Limitations  Transfers;Continence;Toileting;Stand;Bend;Lift;Sleep;Reach Overhead;Carry    Examination-Participation Restrictions  Yard Work;Cleaning;Laundry;Community Activity;Meal Prep;Driving;Shop    Stability/Clinical Decision Making  Evolving/Moderate complexity    Rehab Potential  Good    PT Frequency  1x / week    PT Duration  12 weeks    PT Treatment/Interventions  Moist Heat;Cryotherapy;Electrical Stimulation;ADLs/Self Care Home Management;Gait training;Stair training;Functional mobility training;Therapeutic activities;Scar mobilization;Taping;Dry needling;Passive range of motion;Joint Manipulations;Spinal Manipulations;Manual techniques;Patient/family education;Therapeutic exercise;Balance training;Neuromuscular re-education    PT Next Visit Plan  reassess kegels    PT Home Exercise Plan  toilet posture; bladder diary    Consulted and Agree with Plan of Care  Patient       Patient will benefit from skilled therapeutic intervention in order to improve the following deficits  and impairments:  Abnormal gait, Decreased balance, Postural dysfunction, Improper body mechanics, Decreased range of motion, Decreased strength, Impaired flexibility, Decreased coordination, Decreased activity tolerance, Impaired sensation, Hypomobility, Decreased mobility, Decreased endurance  Visit Diagnosis: Other lack of coordination  Muscle weakness (generalized)  Urinary incontinence, unspecified type     Problem List Patient Active Problem List   Diagnosis Date Noted  . PCOS (polycystic ovarian syndrome) 11/07/2019  . Continuous leakage of urine 11/07/2019  . Other fatigue 11/07/2019  . Class 2 obesity due to excess calories without serious comorbidity with body mass index (BMI) of 38.0 to 38.9 in adult 11/07/2019   Sheria Lang PT, DPT [phone] 11/28/2019, 4:51 PM  [facility] [address]. [place], [place], [address] Phone: [phone]   Fax:  [phone]  Name: RAJNI HOLSWORTH MRN: [id number] Date of Birth: November 30, 1985

## 2019-11-30 ENCOUNTER — Encounter: Payer: Medicaid Other | Admitting: Physical Therapy

## 2019-12-05 ENCOUNTER — Ambulatory Visit: Payer: Medicaid Other | Admitting: Physical Therapy

## 2019-12-07 ENCOUNTER — Encounter: Payer: Medicaid Other | Admitting: Physical Therapy

## 2019-12-12 ENCOUNTER — Other Ambulatory Visit: Payer: Self-pay

## 2019-12-12 ENCOUNTER — Encounter: Payer: Self-pay | Admitting: Physical Therapy

## 2019-12-12 ENCOUNTER — Ambulatory Visit: Payer: Medicaid Other | Admitting: Physical Therapy

## 2019-12-12 DIAGNOSIS — M6281 Muscle weakness (generalized): Secondary | ICD-10-CM

## 2019-12-12 DIAGNOSIS — R278 Other lack of coordination: Secondary | ICD-10-CM | POA: Diagnosis not present

## 2019-12-12 DIAGNOSIS — R32 Unspecified urinary incontinence: Secondary | ICD-10-CM

## 2019-12-12 NOTE — Therapy (Signed)
[facility] [address] [place], [place], [address] Phone: [phone]   Fax:  [phone]  Physical Therapy Treatment  Patient Details  Name: Shannon [name] MRN: [id number] Date of Birth: [birth year] Referring Provider (PT): Gweneth Dimitri MD   Encounter Date: 12/12/2019   PT End of Session - 12/12/19 1611    Visit Number 4    Number of Visits 12    Date for PT Re-Evaluation 02/06/20    PT Start Time 1558    PT Stop Time 1655    PT Time Calculation (min) 57 min    Activity Tolerance Patient tolerated treatment well    Behavior During Therapy [facility] for tasks assessed/performed           Past Medical History:  Diagnosis Date  . Asthma   . Depression   . Hypertension    gestational  . Migraine   . PCOS (polycystic ovarian syndrome)   . PCOS (polycystic ovarian syndrome)   . Urinary incontinence     Past Surgical History:  Procedure Laterality Date  . TONSILLECTOMY    . TONSILLECTOMY  2007    There were no vitals filed for this visit.   Subjective Assessment - 12/12/19 1559    Subjective Patient reports that she had a little more leakage since her last session. Patient also noticed 2-3x over the last week a random, unpredictable pain which she compares to the pop of the rubberband which she felt internally near the urethra (5-6/10). The pain never lasted longer than a second, but was enough to catch her attention. Patient ntes that she feels kegels have been getting easier and she is able ot do them in gravity dependent positions. Patient does report that constipation has improved and bowel movements have gotten a lot easier; she feels this area is trending in the right direction.    Currently in Pain? No/denies           TREATMENT  Neuromuscular Re-education: Reassessed goals; see below. Supine hooklying diaphragmatic breathing with VCs and TCs for PFM coordination and improved management of IAP Supine hooklying,  PFM lengthening with inhalation. VCs and TCs to decrease compensatory patterns and encourage optimal relaxation of the PFM. Supine hooklying, PFM contractions (endurance 4 sec x4) with exhalation. VCs and TCs to decrease compensatory patterns and encourage activation of the PFM. Supine hooklying, PFM contractions (fast twitch x10) with exhalation. VCs and TCs to decrease compensatory patterns and encourage activation of the PFM.   Patient educated throughout session on appropriate technique and form using multi-modal cueing, HEP, and activity modification. Patient articulated understanding and returned demonstration.  Patient Response to interventions: Comfortable with HEP.   ASSESSMENT Patient presents to clinic with excellent motivation to participate in therapy. Patient demonstrates deficits in PFM strength, PFM coordination, posture, balance, B hip abductor strength, and IAP management. Patient able to tolerate high volume of pelvic floor activities including: slow twitch, fast twitch, and eccentric lengthening during today's session and responded positively to active and educational interventions. Patient will benefit from continued skilled therapeutic intervention to address remaining deficits in PFM strength, PFM coordination, posture, balance, B hip abductor strength, and IAP management in order to increase function and improve overall QOL.    PT Long Term Goals - 12/12/19 1612      PT LONG TERM GOAL #1   Title Patient will demonstrate independence with HEP in order to maximize therapeutic gains and improve carryover from physical therapy sessions to ADLs  in the home and community.    Baseline IE: not demonstrated; 6/22: IND    Time 12    Period Weeks    Status Achieved    Target Date 02/06/20      PT LONG TERM GOAL #2   Title Patient will demonstrate circumferential and sequential contraction of >4/5 MMT, > 6 sec hold x10 and 5 consecutive quick flicks with </= 10 min rest between  testing bouts, and relaxation of the PFM coordinated with breath for improved management of intra-abdominal pressure and normal bowel and bladder function without the presence of pain nor incontinence in order to improve participation at home and in the community.    Baseline IE: not demonstrated; 6/22:    Time 12    Period Weeks    Status New      PT LONG TERM GOAL #3   Title Patient will demonstrate improved function as evidenced by a score of 54 on FOTO measure for full participation in activities at home and in the community.    Baseline IE: Urinary 47, Constipation 55; 6/22: Urinary 46, Constipation 55    Time 12    Period Weeks    Status On-going    Target Date 02/06/20      PT LONG TERM GOAL #4   Title Patient will report BMs classified as Type 3-Type 4 on the [facility] Stool Chart greater than 50% of the time without stool softener/laxative to demonstrate improved motility and stool bulking in order to decrease fecal distress and improve overall QOL.    Baseline IE: 0% without stool softener; 6/22: Type 4> 50% of the time with stool softener    Time 12    Period Weeks    Status On-going    Target Date 02/06/20      PT LONG TERM GOAL #5   Title Patient will demonstrate improved toileting posture with knees higher than hips and feet supported, and will report straining <3x/week with bowel movements in order to decrease incidence of constipation/hemorrhoids/mismanagement of intra-abdominal pressure and improve overall QOL.    Baseline IE: not demonstrated; 6/22: using squatty potty; denies straining    Time 12    Period Weeks    Status Achieved    Target Date 02/06/20                 Plan - 12/12/19 1611    Clinical Impression Statement Patient presents to clinic with excellent motivation to participate in therapy. Patient demonstrates deficits in PFM strength, PFM coordination, posture, balance, B hip abductor strength, and IAP management. Patient able to tolerate high  volume of pelvic floor activities including: slow twitch, fast twitch, and eccentric lengthening during today's session and responded positively to active and educational interventions. Patient will benefit from continued skilled therapeutic intervention to address remaining deficits in PFM strength, PFM coordination, posture, balance, B hip abductor strength, and IAP management in order to increase function and improve overall QOL.    Personal Factors and Comorbidities Age;Sex;Comorbidity 3+;Time since onset of injury/illness/exacerbation;Fitness;Behavior Pattern    Comorbidities HTN, Asthma, Migraine, PCOS, depression    Examination-Activity Limitations Transfers;Continence;Toileting;Stand;Bend;Lift;Sleep;Reach Overhead;Carry    Examination-Participation Restrictions Yard Work;Cleaning;Laundry;Community Activity;Meal Prep;Driving;Shop    Stability/Clinical Decision Making Evolving/Moderate complexity    Rehab Potential Good    PT Frequency 1x / week    PT Duration 12 weeks    PT Treatment/Interventions Moist Heat;Cryotherapy;Electrical Stimulation;ADLs/Self Care Home Management;Gait training;Stair training;Functional mobility training;Therapeutic activities;Scar mobilization;Taping;Dry needling;Passive range of motion;Joint Manipulations;Spinal Manipulations;Manual techniques;Patient/family education;Therapeutic  exercise;Balance training;Neuromuscular re-education    PT Next Visit Plan reassess kegels    PT Home Exercise Plan toilet posture; bladder diary    Consulted and Agree with Plan of Care Patient           Patient will benefit from skilled therapeutic intervention in order to improve the following deficits and impairments:  Abnormal gait, Decreased balance, Postural dysfunction, Improper body mechanics, Decreased range of motion, Decreased strength, Impaired flexibility, Decreased coordination, Decreased activity tolerance, Impaired sensation, Hypomobility, Decreased mobility, Decreased  endurance  Visit Diagnosis: Other lack of coordination  Muscle weakness (generalized)  Urinary incontinence, unspecified type     Problem List Patient Active Problem List   Diagnosis Date Noted  . PCOS (polycystic ovarian syndrome) 11/07/2019  . Continuous leakage of urine 11/07/2019  . Other fatigue 11/07/2019  . Class 2 obesity due to excess calories without serious comorbidity with body mass index (BMI) of 38.0 to 38.9 in adult 11/07/2019   Myles Gip PT, DPT [phone] 12/12/2019, 6:22 PM  [facility] [address] [place], [place], [address] Phone: [phone]   Fax:  [phone]  Name: Shannon [name] MRN: [id number] Date of Birth: 09-20-1985

## 2019-12-14 ENCOUNTER — Encounter: Payer: Medicaid Other | Admitting: Physical Therapy

## 2019-12-20 ENCOUNTER — Ambulatory Visit: Payer: Medicaid Other | Admitting: Physical Therapy

## 2019-12-26 ENCOUNTER — Encounter: Payer: Self-pay | Admitting: Family Medicine

## 2019-12-26 ENCOUNTER — Ambulatory Visit (INDEPENDENT_AMBULATORY_CARE_PROVIDER_SITE_OTHER): Payer: Medicaid Other | Admitting: Family Medicine

## 2019-12-26 ENCOUNTER — Other Ambulatory Visit: Payer: Self-pay

## 2019-12-26 VITALS — BP 108/70 | HR 84 | Temp 97.6°F | Ht 67.0 in | Wt 243.5 lb

## 2019-12-26 DIAGNOSIS — K649 Unspecified hemorrhoids: Secondary | ICD-10-CM

## 2019-12-26 MED ORDER — HYDROCORTISONE ACETATE 25 MG RE SUPP: 25.0000 mg | Freq: Two times a day (BID) | RECTAL | 0 refills | Status: DC

## 2019-12-26 NOTE — Assessment & Plan Note (Signed)
Small external hemorrhoid. Did not seem to have internal hemorrhoids, however, painful rectal exam. No thrombosed hemorrhoids noted. Rectal suppository for comfort and discussed options of watch and wait vs referral for possible surgery if option given duration/severity and pt would like to meet with surgeon. No fissure visualized, but wonder if present based on small hemorrhoid and discomfort with exam.

## 2019-12-26 NOTE — Progress Notes (Signed)
   Subjective:     Shannon [name] is a 34 y.o. female presenting for Hemorrhoids (blood in stool x 2 weeks )     HPI   #Hemorrhoids - tender and swollen to touch - has been having blood in stool x 2 weeks - has been taking medications for constipation - painful BM - even with soft BM it is painful  - blood is with streaking stool and when wiping -- occasionally blood in the bowl - is feeling oK - no lightheadedness, breathing difficulty   Review of Systems   Social History   Tobacco Use  Smoking Status Never Smoker  Smokeless Tobacco Never Used        Objective:    BP Readings from Last 3 Encounters:  12/26/19 108/70  11/07/19 108/72  06/27/18 134/75   Wt Readings from Last 3 Encounters:  12/26/19 243 lb 8 oz (110.5 kg)  11/07/19 245 lb 4 oz (111.2 kg)  06/27/18 245 lb (111.1 kg)    BP 108/70   Pulse 84   Temp 97.6 F (36.4 C) (Temporal)   Ht 5\' 7"  (1.702 m)   Wt 243 lb 8 oz (110.5 kg)   LMP 09/18/2019 (Within Days)   SpO2 98%   BMI 38.14 kg/m    Physical Exam Exam conducted with a chaperone present.  Constitutional:      General: She is not in acute distress.    Appearance: She is well-developed. She is not diaphoretic.  HENT:     Right Ear: External ear normal.     Left Ear: External ear normal.  Eyes:     Conjunctiva/sclera: Conjunctivae normal.  Cardiovascular:     Rate and Rhythm: Normal rate.  Pulmonary:     Effort: Pulmonary effort is normal.  Genitourinary:    Rectum: Tenderness and external hemorrhoid present.  Musculoskeletal:     Cervical back: Neck supple.  Skin:    General: Skin is warm and dry.     Capillary Refill: Capillary refill takes less than 2 seconds.  Neurological:     Mental Status: She is alert. Mental status is at baseline.  Psychiatric:        Mood and Affect: Mood normal.        Behavior: Behavior normal.           Assessment & Plan:   Problem List Items Addressed This Visit      Cardiovascular  and Mediastinum   Hemorrhoids - Primary    Small external hemorrhoid. Did not seem to have internal hemorrhoids, however, painful rectal exam. No thrombosed hemorrhoids noted. Rectal suppository for comfort and discussed options of watch and wait vs referral for possible surgery if option given duration/severity and pt would like to meet with surgeon. No fissure visualized, but wonder if present based on small hemorrhoid and discomfort with exam.       Relevant Medications   hydrocortisone (ANUSOL-HC) 25 MG suppository   Other Relevant Orders   Ambulatory referral to General Surgery       Return if symptoms worsen or fail to improve.  09/20/2019, MD  This visit occurred during the SARS-CoV-2 public health emergency.  Safety protocols were in place, including screening questions prior to the visit, additional usage of staff PPE, and extensive cleaning of exam room while observing appropriate contact time as indicated for disinfecting solutions.

## 2019-12-26 NOTE — Patient Instructions (Signed)
Continue stool softener  Referral to surgeon's office.   #Referral I have placed a referral to a specialist for you. You should receive a phone call from the specialty office. Make sure your voicemail is not full and that if you are able to answer your phone to unknown or new numbers.   It may take up to 2 weeks to hear about the referral. If you do not hear anything in 2 weeks, please call our office and ask to speak with the referral coordinator.

## 2019-12-27 ENCOUNTER — Encounter: Payer: Self-pay | Admitting: Physical Therapy

## 2019-12-27 ENCOUNTER — Ambulatory Visit: Payer: Medicaid Other | Attending: Family Medicine | Admitting: Physical Therapy

## 2019-12-27 DIAGNOSIS — R32 Unspecified urinary incontinence: Secondary | ICD-10-CM | POA: Diagnosis present

## 2019-12-27 DIAGNOSIS — M6281 Muscle weakness (generalized): Secondary | ICD-10-CM | POA: Diagnosis present

## 2019-12-27 DIAGNOSIS — R278 Other lack of coordination: Secondary | ICD-10-CM | POA: Insufficient documentation

## 2019-12-27 NOTE — Therapy (Signed)
[place] [facility] [address]. [place], [place], [address] Phone: [phone]   Fax:  [phone]  Physical Therapy Treatment  Patient Details  Name: Shannon [name] MRN: [id number] Date of Birth: [birth year] Referring Provider (PT): Gweneth Dimitri MD   Encounter Date: 12/27/2019   PT End of Session - 12/27/19 1726    Visit Number 5    Number of Visits 12    Date for PT Re-Evaluation 02/06/20    PT Start Time 1712    PT Stop Time 1800    PT Time Calculation (min) 48 min    Activity Tolerance Patient tolerated treatment well    Behavior During Therapy [facility] for tasks assessed/performed           Past Medical History:  Diagnosis Date  . Asthma   . Depression   . Hypertension    gestational  . Migraine   . PCOS (polycystic ovarian syndrome)   . PCOS (polycystic ovarian syndrome)   . Urinary incontinence     Past Surgical History:  Procedure Laterality Date  . TONSILLECTOMY    . TONSILLECTOMY  2007    There were no vitals filed for this visit.   Subjective Assessment - 12/27/19 1715    Subjective Patient presents to clinic 12 minutes late for appointment. Patient notes that she has not had an increase in UI since her last session. She has had increased rectal pain with intermittent frequency. She was seen for an external hemorrhoid and is being referred to surgery for management of the issue. Patient notes she has been doing both kegels and relaxation exercises but has more difficulty with relaxation exercises. Patient notes that she has not had any persistence of the urethral pain. Patient has also made the observation that when she limits her soda intake she has fewer UI incidents.    Currently in Pain? No/denies           TREATMENT  Neuromuscular Re-education: Patient education on bladder irritants and strategies to mitigate bladder irritation. Supine hooklying diaphragmatic breathing with VCs and TCs for PFM coordination  and improved management of IAP Supine knee to chest with PFM lengthening, BLE, for improved PFM spasm release Supine double knee to chest with PFM lengthening, BLE, for improved PFM spasm release Supine happy baby with PFM lengthening, BLE, for improved PFM spasm release Supine butterfly with PFM lengthening, BLE, for improved PFM spasm release   Patient educated throughout session on appropriate technique and form using multi-modal cueing, HEP, and activity modification. Patient articulated understanding and returned demonstration.  Patient Response to interventions: Comfortable with HEP.   ASSESSMENT Patient presents to clinic with excellent motivation to participate in therapy. Patient demonstrates deficits in PFM strength, PFM coordination, posture, balance, B hip abductor strength, and IAP management. Patient able to better sense PFM position with supine stretches during today's session and responded positively to active and educational interventions. Patient will benefit from continued skilled therapeutic intervention to address remaining deficits in PFM strength, PFM coordination, posture, balance, B hip abductor strength, and IAP management in order to increase function and improve overall QOL.     PT Long Term Goals - 12/12/19 1612      PT LONG TERM GOAL #1   Title Patient will demonstrate independence with HEP in order to maximize therapeutic gains and improve carryover from physical therapy sessions to ADLs in the home and community.    Baseline IE: not demonstrated; 6/22: IND    Time  12    Period Weeks    Status Achieved    Target Date 02/06/20      PT LONG TERM GOAL #2   Title Patient will demonstrate circumferential and sequential contraction of >4/5 MMT, > 6 sec hold x10 and 5 consecutive quick flicks with </= 10 min rest between testing bouts, and relaxation of the PFM coordinated with breath for improved management of intra-abdominal pressure and normal bowel and bladder  function without the presence of pain nor incontinence in order to improve participation at home and in the community.    Baseline IE: not demonstrated; 6/22:    Time 12    Period Weeks    Status New      PT LONG TERM GOAL #3   Title Patient will demonstrate improved function as evidenced by a score of 54 on FOTO measure for full participation in activities at home and in the community.    Baseline IE: Urinary 47, Constipation 55; 6/22: Urinary 46, Constipation 55    Time 12    Period Weeks    Status On-going    Target Date 02/06/20      PT LONG TERM GOAL #4   Title Patient will report BMs classified as Type 3-Type 4 on the [facility] Stool Chart greater than 50% of the time without stool softener/laxative to demonstrate improved motility and stool bulking in order to decrease fecal distress and improve overall QOL.    Baseline IE: 0% without stool softener; 6/22: Type 4> 50% of the time with stool softener    Time 12    Period Weeks    Status On-going    Target Date 02/06/20      PT LONG TERM GOAL #5   Title Patient will demonstrate improved toileting posture with knees higher than hips and feet supported, and will report straining <3x/week with bowel movements in order to decrease incidence of constipation/hemorrhoids/mismanagement of intra-abdominal pressure and improve overall QOL.    Baseline IE: not demonstrated; 6/22: using squatty potty; denies straining    Time 12    Period Weeks    Status Achieved    Target Date 02/06/20                 Plan - 12/27/19 1727    Clinical Impression Statement Patient presents to clinic with excellent motivation to participate in therapy. Patient demonstrates deficits in PFM strength, PFM coordination, posture, balance, B hip abductor strength, and IAP management. Patient able to better sense PFM position with supine stretches during today's session and responded positively to active and educational interventions. Patient will benefit from  continued skilled therapeutic intervention to address remaining deficits in PFM strength, PFM coordination, posture, balance, B hip abductor strength, and IAP management in order to increase function and improve overall QOL.    Personal Factors and Comorbidities Age;Sex;Comorbidity 3+;Time since onset of injury/illness/exacerbation;Fitness;Behavior Pattern    Comorbidities HTN, Asthma, Migraine, PCOS, depression    Examination-Activity Limitations Transfers;Continence;Toileting;Stand;Bend;Lift;Sleep;Reach Overhead;Carry    Examination-Participation Restrictions Yard Work;Cleaning;Laundry;Community Activity;Meal Prep;Driving;Shop    Stability/Clinical Decision Making Evolving/Moderate complexity    Rehab Potential Good    PT Frequency 1x / week    PT Duration 12 weeks    PT Treatment/Interventions Moist Heat;Cryotherapy;Electrical Stimulation;ADLs/Self Care Home Management;Gait training;Stair training;Functional mobility training;Therapeutic activities;Scar mobilization;Taping;Dry needling;Passive range of motion;Joint Manipulations;Spinal Manipulations;Manual techniques;Patient/family education;Therapeutic exercise;Balance training;Neuromuscular re-education    PT Next Visit Plan reassess kegels    PT Home Exercise Plan toilet posture; bladder diary  Consulted and Agree with Plan of Care Patient           Patient will benefit from skilled therapeutic intervention in order to improve the following deficits and impairments:  Abnormal gait, Decreased balance, Postural dysfunction, Improper body mechanics, Decreased range of motion, Decreased strength, Impaired flexibility, Decreased coordination, Decreased activity tolerance, Impaired sensation, Hypomobility, Decreased mobility, Decreased endurance  Visit Diagnosis: Other lack of coordination  Muscle weakness (generalized)  Urinary incontinence, unspecified type     Problem List Patient Active Problem List   Diagnosis Date Noted  .  Hemorrhoids 12/26/2019  . PCOS (polycystic ovarian syndrome) 11/07/2019  . Continuous leakage of urine 11/07/2019  . Other fatigue 11/07/2019  . Class 2 obesity due to excess calories without serious comorbidity with body mass index (BMI) of 38.0 to 38.9 in adult 11/07/2019   Sheria Lang PT, DPT [phone] 12/27/2019, 6:02 PM  [place] [address]. [place], [place], [address] Phone: [phone]   Fax:  [phone]  Name: Shannon [name] MRN: [id number] Date of Birth: [birth year]

## 2020-01-03 ENCOUNTER — Other Ambulatory Visit: Payer: Self-pay

## 2020-01-03 ENCOUNTER — Encounter: Payer: Self-pay | Admitting: Physical Therapy

## 2020-01-03 ENCOUNTER — Ambulatory Visit: Payer: Medicaid Other | Admitting: Physical Therapy

## 2020-01-03 DIAGNOSIS — R278 Other lack of coordination: Secondary | ICD-10-CM | POA: Diagnosis not present

## 2020-01-03 DIAGNOSIS — R32 Unspecified urinary incontinence: Secondary | ICD-10-CM

## 2020-01-03 DIAGNOSIS — M6281 Muscle weakness (generalized): Secondary | ICD-10-CM

## 2020-01-03 NOTE — Therapy (Signed)
[place] [facility] [address]. [place], [place], [address] Phone: [phone]   Fax:  [phone]  Physical Therapy Treatment  Patient Details  Name: Shannon [name] MRN: [id number] Date of Birth: May 18, 1986 Referring Provider (PT): Gweneth Dimitri MD   Encounter Date: 01/03/2020   PT End of Session - 01/03/20 1715    Visit Number 6    Number of Visits 12    Date for PT Re-Evaluation 02/06/20    PT Start Time 1710    PT Stop Time 1800    PT Time Calculation (min) 50 min    Activity Tolerance Patient tolerated treatment well    Behavior During Therapy [facility] for tasks assessed/performed           Past Medical History:  Diagnosis Date  . Asthma   . Depression   . Hypertension    gestational  . Migraine   . PCOS (polycystic ovarian syndrome)   . PCOS (polycystic ovarian syndrome)   . Urinary incontinence     Past Surgical History:  Procedure Laterality Date  . TONSILLECTOMY    . TONSILLECTOMY  2007    There were no vitals filed for this visit.   Subjective Assessment - 01/03/20 1714    Subjective Patient presents to clinic 10 minutes late for appointment. Patient notes no changes to urinary symptoms since last session. Patient notes that relaxation exercises are getting easier.    Currently in Pain? No/denies           TREATMENT  Neuromuscular Re-education: Supine hooklying diaphragmatic breathing with VCs and TCs for downregulation of the nervous system and improved management of IAP Supine hooklying, PFM lengthening with inhalation. VCs and TCs to decrease compensatory patterns and encourage optimal relaxation of the PFM. Supine hooklying, PFM contractions (endurance 4 sec x4; fast twitch x3) with exhalation. VCs and TCs to decrease compensatory patterns and encourage activation of the PFM.   Patient educated throughout session on appropriate technique and form using multi-modal cueing, HEP, and activity modification.  Patient articulated understanding and returned demonstration.  Patient Response to interventions: Comfortable with HEP.   ASSESSMENT Patient presents to clinic with excellent motivation to participate in therapy. Patient demonstrates deficits in PFM strength, PFM coordination, posture, balance, B hip abductor strength, and IAP management. Patient able to coordinate and isolate PFM for contractions (2/5 MMT) with limited endurance and repetitions during today's session and responded positively to active and educational interventions. Patient will benefit from continued skilled therapeutic intervention to address remaining deficits in PFM strength, PFM coordination, posture, balance, B hip abductor strength, and IAP management in order to increase function and improve overall QOL.     PT Long Term Goals - 12/12/19 1612      PT LONG TERM GOAL #1   Title Patient will demonstrate independence with HEP in order to maximize therapeutic gains and improve carryover from physical therapy sessions to ADLs in the home and community.    Baseline IE: not demonstrated; 6/22: IND    Time 12    Period Weeks    Status Achieved    Target Date 02/06/20      PT LONG TERM GOAL #2   Title Patient will demonstrate circumferential and sequential contraction of >4/5 MMT, > 6 sec hold x10 and 5 consecutive quick flicks with </= 10 min rest between testing bouts, and relaxation of the PFM coordinated with breath for improved management of intra-abdominal pressure and normal bowel and bladder function without  the presence of pain nor incontinence in order to improve participation at home and in the community.    Baseline IE: not demonstrated; 6/22:    Time 12    Period Weeks    Status New      PT LONG TERM GOAL #3   Title Patient will demonstrate improved function as evidenced by a score of 54 on FOTO measure for full participation in activities at home and in the community.    Baseline IE: Urinary 47, Constipation  55; 6/22: Urinary 46, Constipation 55    Time 12    Period Weeks    Status On-going    Target Date 02/06/20      PT LONG TERM GOAL #4   Title Patient will report BMs classified as Type 3-Type 4 on the [facility] Stool Chart greater than 50% of the time without stool softener/laxative to demonstrate improved motility and stool bulking in order to decrease fecal distress and improve overall QOL.    Baseline IE: 0% without stool softener; 6/22: Type 4> 50% of the time with stool softener    Time 12    Period Weeks    Status On-going    Target Date 02/06/20      PT LONG TERM GOAL #5   Title Patient will demonstrate improved toileting posture with knees higher than hips and feet supported, and will report straining <3x/week with bowel movements in order to decrease incidence of constipation/hemorrhoids/mismanagement of intra-abdominal pressure and improve overall QOL.    Baseline IE: not demonstrated; 6/22: using squatty potty; denies straining    Time 12    Period Weeks    Status Achieved    Target Date 02/06/20                 Plan - 01/03/20 1718    Clinical Impression Statement Patient presents to clinic with excellent motivation to participate in therapy. Patient demonstrates deficits in PFM strength, PFM coordination, posture, balance, B hip abductor strength, and IAP management. Patient able to coordinate and isolate PFM for contractions (2/5 MMT) with limited endurance and repetitions during today's session and responded positively to active and educational interventions. Patient will benefit from continued skilled therapeutic intervention to address remaining deficits in PFM strength, PFM coordination, posture, balance, B hip abductor strength, and IAP management in order to increase function and improve overall QOL.    Personal Factors and Comorbidities Age;Sex;Comorbidity 3+;Time since onset of injury/illness/exacerbation;Fitness;Behavior Pattern    Comorbidities HTN, Asthma,  Migraine, PCOS, depression    Examination-Activity Limitations Transfers;Continence;Toileting;Stand;Bend;Lift;Sleep;Reach Overhead;Carry    Examination-Participation Restrictions Yard Work;Cleaning;Laundry;Community Activity;Meal Prep;Driving;Shop    Stability/Clinical Decision Making Evolving/Moderate complexity    Rehab Potential Good    PT Frequency 1x / week    PT Duration 12 weeks    PT Treatment/Interventions Moist Heat;Cryotherapy;Electrical Stimulation;ADLs/Self Care Home Management;Gait training;Stair training;Functional mobility training;Therapeutic activities;Scar mobilization;Taping;Dry needling;Passive range of motion;Joint Manipulations;Spinal Manipulations;Manual techniques;Patient/family education;Therapeutic exercise;Balance training;Neuromuscular re-education    PT Next Visit Plan assess PFM strength; progress to quick flick training as appropriate    PT Home Exercise Plan toilet posture; bladder diary    Consulted and Agree with Plan of Care Patient           Patient will benefit from skilled therapeutic intervention in order to improve the following deficits and impairments:  Abnormal gait, Decreased balance, Postural dysfunction, Improper body mechanics, Decreased range of motion, Decreased strength, Impaired flexibility, Decreased coordination, Decreased activity tolerance, Impaired sensation, Hypomobility, Decreased mobility, Decreased endurance  Visit Diagnosis: Other lack of coordination  Muscle weakness (generalized)  Urinary incontinence, unspecified type     Problem List Patient Active Problem List   Diagnosis Date Noted  . Hemorrhoids 12/26/2019  . PCOS (polycystic ovarian syndrome) 11/07/2019  . Continuous leakage of urine 11/07/2019  . Other fatigue 11/07/2019  . Class 2 obesity due to excess calories without serious comorbidity with body mass index (BMI) of 38.0 to 38.9 in adult 11/07/2019   Sheria Lang PT, DPT [phone] 01/03/2020, 5:49 PM  [facility] [address] [place], [place], [address] Phone: [phone]   Fax:  [phone]  Name: YADIRA HADA MRN: [id number] Date of Birth: 18-May-1986

## 2020-01-06 ENCOUNTER — Other Ambulatory Visit: Payer: Self-pay | Admitting: Family Medicine

## 2020-01-06 DIAGNOSIS — E559 Vitamin D deficiency, unspecified: Secondary | ICD-10-CM

## 2020-01-10 ENCOUNTER — Encounter: Payer: Self-pay | Admitting: Physical Therapy

## 2020-01-11 ENCOUNTER — Ambulatory Visit: Payer: Self-pay | Admitting: Surgery

## 2020-01-17 ENCOUNTER — Ambulatory Visit: Payer: Medicaid Other | Admitting: Physical Therapy

## 2020-01-19 ENCOUNTER — Other Ambulatory Visit: Payer: Self-pay

## 2020-01-19 ENCOUNTER — Ambulatory Visit
Admission: EM | Admit: 2020-01-19 | Discharge: 2020-01-19 | Disposition: A | Discharge status: Home / Self Care | Payer: Medicaid Other | Attending: Internal Medicine | Admitting: Internal Medicine

## 2020-01-19 ENCOUNTER — Encounter: Payer: Self-pay | Admitting: Emergency Medicine

## 2020-01-19 DIAGNOSIS — G5139 Clonic hemifacial spasm, unspecified: Secondary | ICD-10-CM | POA: Insufficient documentation

## 2020-01-19 LAB — BASIC METABOLIC PANEL
Anion gap: 5 (ref 5–15)
BUN: 16 mg/dL (ref 6–20)
CO2: 31 mmol/L (ref 22–32)
Calcium: 9.2 mg/dL (ref 8.9–10.3)
Chloride: 101 mmol/L (ref 98–111)
Creatinine, Ser: 1.33 mg/dL — ABNORMAL HIGH (ref 0.44–1.00)
GFR calc Af Amer: >60 mL/min (ref >60)
GFR calc non Af Amer: 52 mL/min — ABNORMAL LOW (ref >60)
Glucose, Bld: 95 mg/dL (ref 70–99)
Potassium: 4.3 mmol/L (ref 3.5–5.1)
Sodium: 137 mmol/L (ref 135–145)

## 2020-01-19 LAB — VITAMIN D 25 HYDROXY (VIT D DEFICIENCY, FRACTURES): Vit D, 25-Hydroxy: 32.23 ng/mL (ref 30–100)

## 2020-01-19 NOTE — ED Provider Notes (Signed)
[facility]    CSN: [id number] Arrival date & time: 01/19/20  1753      History   Chief Complaint Chief Complaint  Patient presents with  . Spasms    facial    HPI Shannon [name] is a 34 y.o. female comes to the urgent care with concerns for facial muscle spasms over the past few days.  Patient says that this has been mainly over the face.  She has had a few spasms in the upper arm.  She denies any pain.  No numbness or tingling.   No rash on the face.  Patient was diagnosed with vitamin D deficiency in April and has been taking 50,000 units of vitamin D weekly since then.  Patient denies any nausea vomiting or diarrhea.  No sick contacts.  No fever or chills.  No facial changes or changes in speech.  No difficulty swallowing.  HPI  Past Medical History:  Diagnosis Date  . Asthma   . Depression   . Hypertension    gestational  . Migraine   . PCOS (polycystic ovarian syndrome)   . PCOS (polycystic ovarian syndrome)   . Urinary incontinence     Patient Active Problem List   Diagnosis Date Noted  . Hemorrhoids 12/26/2019  . PCOS (polycystic ovarian syndrome) 11/07/2019  . Continuous leakage of urine 11/07/2019  . Other fatigue 11/07/2019  . Class 2 obesity due to excess calories without serious comorbidity with body mass index (BMI) of 38.0 to 38.9 in adult 11/07/2019    Past Surgical History:  Procedure Laterality Date  . TONSILLECTOMY    . TONSILLECTOMY  2007    OB History    Gravida  1   Para  1   Term  1   Preterm      AB      Living  1     SAB      TAB      Ectopic      Multiple      Live Births  1            Home Medications    Prior to Admission medications   Medication Sig Start Date End Date Taking? Authorizing Provider  Ferrous Sulfate (IRON) 325 (65 Fe) MG TABS Take 1 tablet (325 mg total) by mouth daily. 11/08/19  Yes Lynnda Child, MD  Vitamin D, Ergocalciferol, (DRISDOL) 1.25 MG (50000 UNIT) CAPS capsule Take  1 capsule (50,000 Units total) by mouth every 7 (seven) days. 11/08/19   Lynnda Child, MD    Family History Family History  Problem Relation Age of Onset  . Diabetes Maternal Aunt   . Diabetes Paternal Aunt   . Healthy Maternal Grandmother   . Colon cancer Paternal Grandmother 41  . Stroke Paternal Grandfather 40  . Hypertension Paternal Grandfather   . Hypertension Mother   . Hypertension Father   . Asthma Father   . Healthy Sister   . Asthma Son   . Learning disabilities Son   . Autism Son     Social History Social History   Tobacco Use  . Smoking status: Never Smoker  . Smokeless tobacco: Never Used  Vaping Use  . Vaping Use: Never used  Substance Use Topics  . Alcohol use: No  . Drug use: No     Allergies   Lincomycin, Clindamycin/lincomycin, and Penicillins   Review of Systems Review of Systems  Constitutional: Negative.   HENT: Negative.   Genitourinary:  Negative.   Neurological: Negative for dizziness, tremors, seizures, syncope, facial asymmetry, speech difficulty, light-headedness, numbness and headaches.  Psychiatric/Behavioral: Negative.      Physical Exam Triage Vital Signs ED Triage Vitals  Enc Vitals Group     BP 01/19/20 1818 (!) 118/86     Pulse Rate 01/19/20 1818 90     Resp 01/19/20 1818 15     Temp 01/19/20 1818 98.3 F (36.8 C)     Temp Source 01/19/20 1818 Oral     SpO2 01/19/20 1818 98 %     Weight 01/19/20 1815 (!) 240 lb (108.9 kg)     Height 01/19/20 1815 5\' 7"  (1.702 m)     Head Circumference --      Peak Flow --      Pain Score 01/19/20 1815 0     Pain Loc --      Pain Edu? --      Excl. in GC? --    No data found.  Updated Vital Signs BP (!) 118/86 (BP Location: Right Arm)   Pulse 90   Temp 98.3 F (36.8 C) (Oral)   Resp 15   Ht 5\' 7"  (1.702 m)   Wt (!) 108.9 kg   LMP 10/20/2019 (Approximate)   SpO2 98%   BMI 37.59 kg/m   Visual Acuity Right Eye Distance:   Left Eye Distance:   Bilateral Distance:     Right Eye Near:   Left Eye Near:    Bilateral Near:     Physical Exam Constitutional:      Appearance: Normal appearance.  Cardiovascular:     Rate and Rhythm: Normal rate and regular rhythm.  Skin:    General: Skin is warm.     Capillary Refill: Capillary refill takes less than 2 seconds.  Neurological:     General: No focal deficit present.     Mental Status: She is alert and oriented to person, place, and time. Mental status is at baseline.     Cranial Nerves: No cranial nerve deficit.     Sensory: No sensory deficit.     Coordination: Coordination normal.      UC Treatments / Results  Labs (all labs ordered are listed, but only abnormal results are displayed) Labs Reviewed  BASIC METABOLIC PANEL  VITAMIN D 25 HYDROXY (VIT D DEFICIENCY, FRACTURES)    EKG   Radiology No results found.  Procedures Procedures (including critical care time)  Medications Ordered in UC Medications - No data to display  Initial Impression / Assessment and Plan / UC Course  I have reviewed the triage vital signs and the nursing notes.  Pertinent labs & imaging results that were available during my care of the patient were reviewed by me and considered in my medical decision making (see chart for details).     1.  Facial spasms: BMP, vitamin D If patient symptoms persist she is advised to follow-up with primary care physician and eventually be seen by her neurologist No medications at this time.  If patient symptoms worsens she should see the primary care physician sooner than currently scheduled. Final Clinical Impressions(s) / UC Diagnoses   Final diagnoses:  Facial spasm     Discharge Instructions     If facial spasms worsens or if it spreads to other areas of the body and is persistent-please return to the urgent care to be reevaluated.   ED Prescriptions    None     PDMP not reviewed this encounter.  Merrilee Jansky, MD 01/19/20 [phone]

## 2020-01-19 NOTE — ED Triage Notes (Signed)
Patient c/o muscle spasm off and on in different parts of her body such as in her right arm, left facial cheek, and left upper arm since Tuesday.  Patient denies any pain.  Patient denies HAs.  Patient reports stuffy and runny nose.  Patient denies fevers.

## 2020-01-19 NOTE — Discharge Instructions (Signed)
If facial spasms worsens or if it spreads to other areas of the body and is persistent-please return to the urgent care to be reevaluated.

## 2020-01-24 ENCOUNTER — Ambulatory Visit: Payer: Medicaid Other | Attending: Family Medicine | Admitting: Physical Therapy

## 2020-01-24 DIAGNOSIS — R278 Other lack of coordination: Secondary | ICD-10-CM | POA: Insufficient documentation

## 2020-01-24 DIAGNOSIS — M6281 Muscle weakness (generalized): Secondary | ICD-10-CM | POA: Insufficient documentation

## 2020-01-24 DIAGNOSIS — R32 Unspecified urinary incontinence: Secondary | ICD-10-CM | POA: Insufficient documentation

## 2020-01-31 ENCOUNTER — Encounter: Payer: Self-pay | Admitting: Physical Therapy

## 2020-01-31 ENCOUNTER — Other Ambulatory Visit: Payer: Self-pay

## 2020-01-31 ENCOUNTER — Ambulatory Visit: Payer: Medicaid Other | Admitting: Physical Therapy

## 2020-01-31 DIAGNOSIS — M6281 Muscle weakness (generalized): Secondary | ICD-10-CM | POA: Diagnosis not present

## 2020-01-31 DIAGNOSIS — R278 Other lack of coordination: Secondary | ICD-10-CM

## 2020-01-31 DIAGNOSIS — R32 Unspecified urinary incontinence: Secondary | ICD-10-CM

## 2020-01-31 NOTE — Therapy (Signed)
[place] [facility] [address] [place], [place], [address] Phone: [phone]   Fax:  [phone]  Physical Therapy Treatment  Patient Details  Name: Shannon [name] MRN: [id number] Date of Birth: 05-06-1986 Referring Provider (PT): Waunita Schooner MD   Encounter Date: 01/31/2020   PT End of Session - 01/31/20 1714    Visit Number 7    Number of Visits 12    Date for PT Re-Evaluation 02/06/20    PT Start Time 5697    PT Stop Time 1755    PT Time Calculation (min) 43 min    Activity Tolerance Patient tolerated treatment well    Behavior During Therapy [facility] for tasks assessed/performed           Past Medical History:  Diagnosis Date  . Asthma   . Depression   . Hypertension    gestational  . Migraine   . PCOS (polycystic ovarian syndrome)   . PCOS (polycystic ovarian syndrome)   . Urinary incontinence     Past Surgical History:  Procedure Laterality Date  . TONSILLECTOMY    . TONSILLECTOMY  2007    There were no vitals filed for this visit.   Subjective Assessment - 01/31/20 1713    Subjective Patient presents to clinic 12 minutes late to the appointment. Patient notes no changes to her symptoms since last session.    Currently in Pain? No/denies           TREATMENT  Neuromuscular Re-education: Supine hooklying diaphragmatic breathing with VCs and TCs for downregulation of the nervous system and improved management of IAP Supine hooklying, PFM lengthening with inhalation. VCs and TCs to decrease compensatory patterns and encourage optimal relaxation of the PFM. Supine hooklying, PFM contractions 3-4/5 MMT (endurance 4 sec x3; fast twitch x3) with exhalation. VCs and TCs to decrease compensatory patterns and encourage activation of the PFM.   Patient educated throughout session on appropriate technique and form using multi-modal cueing, HEP, and activity modification. Patient articulated understanding and returned  demonstration.  Patient Response to interventions: Comfortable with HEP and return in 1 month  ASSESSMENT Patient presents to clinic with excellent motivation to participate in therapy. Patient demonstrates deficits in PFM strength, PFM coordination, posture, balance, B hip abductor strength, and IAP management. Patient indicated on outcome measures significant improvement and goal achievement with respect to both urinary and bowel complaints. Patient able to coordinate and isolate PFM for contractions with 3-4/5 MMT during today's session and responded positively to active and educational interventions. Patient will benefit from continued skilled therapeutic intervention to address remaining deficits in PFM strength, PFM coordination, posture, balance, B hip abductor strength, and IAP management in order to increase function and improve overall QOL.     PT Long Term Goals - 01/31/20 1731      PT LONG TERM GOAL #1   Title Patient will demonstrate independence with HEP in order to maximize therapeutic gains and improve carryover from physical therapy sessions to ADLs in the home and community.    Baseline IE: not demonstrated; 6/22: IND    Time 12    Period Weeks    Status Achieved    Target Date 02/06/20      PT LONG TERM GOAL #2   Title Patient will demonstrate circumferential and sequential contraction of >4/5 MMT, > 6 sec hold x10 and 5 consecutive quick flicks with </= 10 min rest between testing bouts, and relaxation of the PFM coordinated with breath  for improved management of intra-abdominal pressure and normal bowel and bladder function without the presence of pain nor incontinence in order to improve participation at home and in the community.    Baseline IE: not demonstrated; 8/11: 3/5 MMT, 4 sec hold x2, 3 consecutive quick flicks    Time 12    Period Weeks    Status On-going    Target Date 02/06/20      PT LONG TERM GOAL #3   Title Patient will demonstrate improved function  as evidenced by a score of 54 on FOTO measure for full participation in activities at home and in the community.    Baseline IE: Urinary 47, Constipation 55; 6/22: Urinary 46, Constipation 55; 8/11: Urinary 59, Constipation 68    Time 12    Period Weeks    Status Achieved    Target Date 02/06/20      PT LONG TERM GOAL #4   Title Patient will report BMs classified as Type 3-Type 4 on the [facility] Stool Chart greater than 50% of the time without stool softener/laxative to demonstrate improved motility and stool bulking in order to decrease fecal distress and improve overall QOL.    Baseline IE: 0% without stool softener; 6/22: Type 4> 50% of the time with stool softener; 8/11: Type 4 80%    Time 12    Period Weeks    Status Partially Met    Target Date 02/06/20      PT LONG TERM GOAL #5   Title Patient will demonstrate improved toileting posture with knees higher than hips and feet supported, and will report straining <3x/week with bowel movements in order to decrease incidence of constipation/hemorrhoids/mismanagement of intra-abdominal pressure and improve overall QOL.    Baseline IE: not demonstrated; 6/22: using squatty potty; denies straining    Time 12    Period Weeks    Status Achieved    Target Date 02/06/20                 Plan - 01/31/20 1723    Clinical Impression Statement Patient presents to clinic with excellent motivation to participate in therapy. Patient demonstrates deficits in PFM strength, PFM coordination, posture, balance, B hip abductor strength, and IAP management. Patient indicated on outcome measures significant improvement and goal achievement with respect to both urinary and bowel complaints. Patient able to coordinate and isolate PFM for contractions with 3-4/5 MMT during today's session and responded positively to active and educational interventions. Patient will benefit from continued skilled therapeutic intervention to address remaining deficits in PFM  strength, PFM coordination, posture, balance, B hip abductor strength, and IAP management in order to increase function and improve overall QOL.    Personal Factors and Comorbidities Age;Sex;Comorbidity 3+;Time since onset of injury/illness/exacerbation;Fitness;Behavior Pattern    Comorbidities HTN, Asthma, Migraine, PCOS, depression    Examination-Activity Limitations Transfers;Continence;Toileting;Stand;Bend;Lift;Sleep;Reach Overhead;Carry    Examination-Participation Restrictions Yard Work;Cleaning;Laundry;Community Activity;Meal Prep;Driving;Shop    Stability/Clinical Decision Making Evolving/Moderate complexity    Rehab Potential Good    PT Frequency 1x / week    PT Duration 12 weeks    PT Treatment/Interventions Moist Heat;Cryotherapy;Electrical Stimulation;ADLs/Self Care Home Management;Gait training;Stair training;Functional mobility training;Therapeutic activities;Scar mobilization;Taping;Dry needling;Passive range of motion;Joint Manipulations;Spinal Manipulations;Manual techniques;Patient/family education;Therapeutic exercise;Balance training;Neuromuscular re-education    PT Next Visit Plan assess PFM strength    PT Home Exercise Plan PFM strengthening    Consulted and Agree with Plan of Care Patient           Patient will benefit  from skilled therapeutic intervention in order to improve the following deficits and impairments:  Abnormal gait, Decreased balance, Postural dysfunction, Improper body mechanics, Decreased range of motion, Decreased strength, Impaired flexibility, Decreased coordination, Decreased activity tolerance, Impaired sensation, Hypomobility, Decreased mobility, Decreased endurance  Visit Diagnosis: Other lack of coordination  Muscle weakness (generalized)  Urinary incontinence, unspecified type     Problem List Patient Active Problem List   Diagnosis Date Noted  . Hemorrhoids 12/26/2019  . PCOS (polycystic ovarian syndrome) 11/07/2019  . Continuous  leakage of urine 11/07/2019  . Other fatigue 11/07/2019  . Class 2 obesity due to excess calories without serious comorbidity with body mass index (BMI) of 38.0 to 38.9 in adult 11/07/2019   Myles Gip PT, DPT [phone]  01/31/2020, 6:01 PM  [place] [facility] [address] [place], [place], [address] Phone: [phone]   Fax:  [phone]  Name: Shannon [name] MRN: [id number] Date of Birth: 12-16-1985

## 2020-02-01 ENCOUNTER — Encounter: Payer: Self-pay | Admitting: Surgery

## 2020-02-01 ENCOUNTER — Other Ambulatory Visit: Payer: Self-pay

## 2020-02-01 ENCOUNTER — Ambulatory Visit (INDEPENDENT_AMBULATORY_CARE_PROVIDER_SITE_OTHER): Payer: Medicaid Other | Admitting: Surgery

## 2020-02-01 VITALS — BP 134/81 | HR 99 | Temp 98.2°F | Resp 12 | Ht 67.0 in | Wt 241.6 lb

## 2020-02-01 DIAGNOSIS — K640 First degree hemorrhoids: Secondary | ICD-10-CM | POA: Diagnosis not present

## 2020-02-01 NOTE — Patient Instructions (Addendum)
Start taking a fiber supplementation, either benefiber, metamucil or fiber gummies.   See your appointment below, call the office if you have any questions or concerns.   High-Fiber Diet Fiber, also called dietary fiber, is a type of carbohydrate that is found in fruits, vegetables, whole grains, and beans. A high-fiber diet can have many health benefits. Your health care provider may recommend a high-fiber diet to help:  Prevent constipation. Fiber can make your bowel movements more regular.  Lower your cholesterol.  Relieve the following conditions: ? Swelling of veins in the anus (hemorrhoids). ? Swelling and irritation (inflammation) of specific areas of the digestive tract (uncomplicated diverticulosis). ? A problem of the large intestine (colon) that sometimes causes pain and diarrhea (irritable bowel syndrome, IBS).  Prevent overeating as part of a weight-loss plan.  Prevent heart disease, type 2 diabetes, and certain cancers. What is my plan? The recommended daily fiber intake in grams (g) includes:  38 g for men age 73 or younger.  30 g for men over age 32.  25 g for women age 26 or younger.  21 g for women over age 40. You can get the recommended daily intake of dietary fiber by:  Eating a variety of fruits, vegetables, grains, and beans.  Taking a fiber supplement, if it is not possible to get enough fiber through your diet. What do I need to know about a high-fiber diet?  It is better to get fiber through food sources rather than from fiber supplements. There is not a lot of research about how effective supplements are.  Always check the fiber content on the nutrition facts label of any prepackaged food. Look for foods that contain 5 g of fiber or more per serving.  Talk with a diet and nutrition specialist (dietitian) if you have questions about specific foods that are recommended or not recommended for your medical condition, especially if those foods are not  listed below.  Gradually increase how much fiber you consume. If you increase your intake of dietary fiber too quickly, you may have bloating, cramping, or gas.  Drink plenty of water. Water helps you to digest fiber. What are tips for following this plan?  Eat a wide variety of high-fiber foods.  Make sure that half of the grains that you eat each day are whole grains.  Eat breads and cereals that are made with whole-grain flour instead of refined flour or white flour.  Eat brown rice, bulgur wheat, or millet instead of white rice.  Start the day with a breakfast that is high in fiber, such as a cereal that contains 5 g of fiber or more per serving.  Use beans in place of meat in soups, salads, and pasta dishes.  Eat high-fiber snacks, such as berries, raw vegetables, nuts, and popcorn.  Choose whole fruits and vegetables instead of processed forms like juice or sauce. What foods can I eat?  Fruits Berries. Pears. Apples. Oranges. Avocado. Prunes and raisins. Dried figs. Vegetables Sweet potatoes. Spinach. Kale. Artichokes. Cabbage. Broccoli. Cauliflower. Green peas. Carrots. Squash. Grains Whole-grain breads. Multigrain cereal. Oats and oatmeal. Brown rice. Barley. Bulgur wheat. Millet. Quinoa. Bran muffins. Popcorn. Rye wafer crackers. Meats and other proteins Navy, kidney, and pinto beans. Soybeans. Split peas. Lentils. Nuts and seeds. Dairy Fiber-fortified yogurt. Beverages Fiber-fortified soy milk. Fiber-fortified orange juice. Other foods Fiber bars. The items listed above may not be a complete list of recommended foods and beverages. Contact a dietitian for more options. What foods  are not recommended? Fruits Fruit juice. Cooked, strained fruit. Vegetables Fried potatoes. Canned vegetables. Well-cooked vegetables. Grains White bread. Pasta made with refined flour. White rice. Meats and other proteins Fatty cuts of meat. Fried chicken or fried fish. Dairy Milk.  Yogurt. Cream cheese. Sour cream. Fats and oils Butters. Beverages Soft drinks. Other foods Cakes and pastries. The items listed above may not be a complete list of foods and beverages to avoid. Contact a dietitian for more information. Summary  Fiber is a type of carbohydrate. It is found in fruits, vegetables, whole grains, and beans.  There are many health benefits of eating a high-fiber diet, such as preventing constipation, lowering blood cholesterol, helping with weight loss, and reducing your risk of heart disease, diabetes, and certain cancers.  Gradually increase your intake of fiber. Increasing too fast can result in cramping, bloating, and gas. Drink plenty of water while you increase your fiber.  The best sources of fiber include whole fruits and vegetables, whole grains, nuts, seeds, and beans. This information is not intended to replace advice given to you by your health care provider. Make sure you discuss any questions you have with your health care provider. Document Revised: 04/12/2017 Document Reviewed: 04/12/2017 Elsevier Patient Education  2020 [employer].

## 2020-02-01 NOTE — Progress Notes (Signed)
Patient ID: Shannon [name], female   DOB: 09-02-1985, 34 y.o.   MRN: [id number]  Chief Complaint: Hemorrhoids  History of Present Illness Shannon [name] is a 34 y.o. female with complaint of hemorrhoids, duration about a year.  She is uncertain this began.  She has a history of constipation occasionally skipping days, currently taking laxatives of Dulcolax or milk of magnesia.  She reports occasional bright red blood associated with bowel movements, usually noted with wiping.  She denies any blood between bowel movements or pain between bowel movements.  However her pain with bowel movements is about a 3 out of 10.  She has not utilize sitz bath's, but has utilized topical Preparation H.  She has a paternal grandmother with a history of colon cancer. She reports she senses pushing tissue out through her anus but nothing that requires replacement/reduction.   Past Medical History Past Medical History:  Diagnosis Date  . Asthma   . Depression   . Hypertension    gestational  . Migraine   . PCOS (polycystic ovarian syndrome)   . PCOS (polycystic ovarian syndrome)   . Urinary incontinence       Past Surgical History:  Procedure Laterality Date  . TONSILLECTOMY    . TONSILLECTOMY  2007    Allergies  Allergen Reactions  . Lincomycin Hives  . Clindamycin/Lincomycin Hives  . Penicillins Other (See Comments)    Pt not sure was told she was allergic when she was younger.      Current Outpatient Medications  Medication Sig Dispense Refill  . Ferrous Sulfate (IRON) 325 (65 Fe) MG TABS Take 1 tablet (325 mg total) by mouth daily. 90 tablet 1  . Vitamin D, Ergocalciferol, (DRISDOL) 1.25 MG (50000 UNIT) CAPS capsule Take 1 capsule (50,000 Units total) by mouth every 7 (seven) days. 4 capsule 1   No current facility-administered medications for this visit.    Family History Family History  Problem Relation Age of Onset  . Diabetes Maternal Aunt   . Diabetes Paternal Aunt   . Healthy  Maternal Grandmother   . Colon cancer Paternal Grandmother 49  . Stroke Paternal Grandfather 74  . Hypertension Paternal Grandfather   . Hypertension Mother   . Hypertension Father   . Asthma Father   . Healthy Sister   . Asthma Son   . Learning disabilities Son   . Autism Son       Social History Social History   Tobacco Use  . Smoking status: Never Smoker  . Smokeless tobacco: Never Used  Vaping Use  . Vaping Use: Never used  Substance Use Topics  . Alcohol use: No  . Drug use: No        Review of Systems  Constitutional: Negative.   HENT: Negative.   Eyes: Negative.   Respiratory: Negative.   Cardiovascular: Negative.   Gastrointestinal: Positive for blood in stool and constipation. Negative for abdominal pain, diarrhea, heartburn, melena, nausea and vomiting.  Genitourinary: Negative for dysuria, flank pain, frequency, hematuria and urgency.  Skin: Negative.   Neurological: Negative.   Psychiatric/Behavioral: Negative.       Physical Exam Blood pressure 134/81, pulse 99, temperature 98.2 F (36.8 C), resp. rate 12, height 5\' 7"  (1.702 m), weight 241 lb 9.6 oz (109.6 kg), SpO2 96 %. Last Weight  Most recent update: 02/01/2020 11:20 AM   Weight  109.6 kg (241 lb 9.6 oz)            CONSTITUTIONAL: Well  developed, and nourished, obese, appropriately responsive and aware without distress.   EYES: Sclera non-icteric.   EARS, NOSE, MOUTH AND THROAT: Mask worn.   The oropharynx is clear.    Hearing is intact to voice.  NECK: Trachea is midline, and there is no jugular venous distension.  LYMPH NODES:  Lymph nodes in the neck are not enlarged. RESPIRATORY:  Lungs are clear, and breath sounds are equal bilaterally. Normal respiratory effort without pathologic use of accessory muscles. CARDIOVASCULAR: Heart is regular in rate and rhythm. GI: The abdomen is  soft, nontender, and nondistended. There were no palpable masses. I did not appreciate hepatosplenomegaly.  There were normal bowel sounds. GU: Digital rectal exam revealed a fair degree of spasm, relatively small diameter of anal canal without redundant hemorrhoidal tissue, piles, fissure.  There is no external anal skin tags or evidence of induration or inflammatory changes present.  There is no bright red blood per glove. MUSCULOSKELETAL:  Symmetrical muscle tone appreciated in all four extremities.    SKIN: Skin turgor is normal. No pathologic skin lesions appreciated.  NEUROLOGIC:  Motor and sensation appear grossly normal.  Cranial nerves are grossly without defect. PSYCH:  Alert and oriented to person, place and time. Affect is appropriate for situation.  Data Reviewed I have personally reviewed what is currently available of the patient's imaging, recent labs and medical records.   Labs:  CBC Latest Ref Rng & Units 11/07/2019 06/27/2018 01/14/2017  WBC 4.0 - 10.5 K/uL 6.2 6.7 8.7  Hemoglobin 12.0 - 15.0 g/dL 40.9 73.5 32.9  Hematocrit 36 - 46 % 37.3 38.4 37.4  Platelets 150 - 400 K/uL 293.0 330 255   CMP Latest Ref Rng & Units 01/19/2020 11/07/2019 06/27/2018  Glucose 70 - 99 mg/dL 95 99 924(Q)  BUN 6 - 20 mg/dL 16 15 10   Creatinine 0.44 - 1.00 mg/dL ) 6.83(M 1.96)  Sodium 135 - 145 mmol/L 137 138 139  Potassium 3.5 - 5.1 mmol/L 4.3 4.3 3.6  Chloride 98 - 111 mmol/L 101 102 103  CO2 22 - 32 mmol/L 31 32 30  Calcium 8.9 - 10.3 mg/dL 9.2 8.8 8.9  Total Protein 6.0 - 8.3 g/dL - 7.0 -  Total Bilirubin 0.2 - 1.2 mg/dL - 0.3 -  Alkaline Phos 39 - 117 U/L - 87 -  AST 0 - 37 U/L - 14 -  ALT 0 - 35 U/L - 8 -      Imaging:  Within last 24 hrs: No results found.  Assessment    Grade 1 hemorrhoids. History of constipation. Patient Active Problem List   Diagnosis Date Noted  . Hemorrhoids 12/26/2019  . PCOS (polycystic ovarian syndrome) 11/07/2019  . Continuous leakage of urine 11/07/2019  . Other fatigue 11/07/2019  . Class 2 obesity due to excess calories without serious  comorbidity with body mass index (BMI) of 38.0 to 38.9 in adult 11/07/2019    Plan    We discussed medical management, with emphasis on fiber and fluid intake.  Instructions given regarding goals of fiber intake/supplementation to supplement her reportedly good fluid intake.  We discussed responding to her gastrocolic reflex postprandially.  And anticipate that some of her symptoms will improve with just that alone given time.  She did want to have another evaluation in about a month and I have agreed to that.  She was well aware that at present there is really no indication for surgery based on her examination alone.  Face-to-face time spent with the  patient and accompanying care providers(if present) was 40 minutes, with more than 50% of the time spent counseling, educating, and coordinating care of the patient.      Campbell Lerner M.D., FACS 02/01/2020, 11:51 AM

## 2020-02-07 ENCOUNTER — Ambulatory Visit: Payer: Medicaid Other | Admitting: Physical Therapy

## 2020-02-07 ENCOUNTER — Ambulatory Visit: Payer: Medicaid Other | Admitting: Family Medicine

## 2020-02-14 ENCOUNTER — Ambulatory Visit: Payer: Medicaid Other | Admitting: Physical Therapy

## 2020-02-15 ENCOUNTER — Encounter: Payer: Self-pay | Admitting: Family Medicine

## 2020-02-15 ENCOUNTER — Other Ambulatory Visit: Payer: Self-pay

## 2020-02-15 ENCOUNTER — Ambulatory Visit: Payer: Medicaid Other | Admitting: Family Medicine

## 2020-02-15 VITALS — BP 100/80 | HR 96 | Temp 97.7°F | Ht 67.0 in | Wt 239.5 lb

## 2020-02-15 DIAGNOSIS — N3945 Continuous leakage: Secondary | ICD-10-CM

## 2020-02-15 DIAGNOSIS — K640 First degree hemorrhoids: Secondary | ICD-10-CM | POA: Diagnosis not present

## 2020-02-15 DIAGNOSIS — R5383 Other fatigue: Secondary | ICD-10-CM

## 2020-02-15 DIAGNOSIS — Z23 Encounter for immunization: Secondary | ICD-10-CM | POA: Diagnosis not present

## 2020-02-15 NOTE — Assessment & Plan Note (Signed)
Improving with pelvic PT. Appreciate care

## 2020-02-15 NOTE — Assessment & Plan Note (Signed)
Likely 2/2 vit d deficiency and low iron. Replacing with improvement in symptoms.

## 2020-02-15 NOTE — Patient Instructions (Addendum)
Continue vitamin D daily Continue iron daily or every other day if constipation  Facial twitch - hydration and sleep but let me know if worsening

## 2020-02-15 NOTE — Progress Notes (Signed)
   Subjective:     Shannon [name] is a 34 y.o. female presenting for Follow-up (3 month )     HPI  #hemorrhoids - will get occasional blood with BM - saw surgery - no need for surgery - no dark or tarry stools - no weight loss  #fatigue - improving - on ferritin daily - walking when she can get outside    Review of Systems   Social History   Tobacco Use  Smoking Status Never Smoker  Smokeless Tobacco Never Used        Objective:    BP Readings from Last 3 Encounters:  02/15/20 100/80  02/01/20 134/81  01/19/20 (!) 118/86   Wt Readings from Last 3 Encounters:  02/15/20 239 lb 8 oz (108.6 kg)  02/01/20 241 lb 9.6 oz (109.6 kg)  01/19/20 (!) 240 lb (108.9 kg)    BP 100/80   Pulse 96   Temp 97.7 F (36.5 C) (Temporal)   Ht 5\' 7"  (1.702 m)   Wt 239 lb 8 oz (108.6 kg)   SpO2 100%   BMI 37.51 kg/m    Physical Exam Constitutional:      General: She is not in acute distress.    Appearance: She is well-developed. She is not diaphoretic.  HENT:     Right Ear: External ear normal.     Left Ear: External ear normal.     Nose: Nose normal.  Eyes:     Conjunctiva/sclera: Conjunctivae normal.  Cardiovascular:     Rate and Rhythm: Normal rate and regular rhythm.     Heart sounds: No murmur heard.   Pulmonary:     Effort: Pulmonary effort is normal. No respiratory distress.     Breath sounds: Normal breath sounds. No wheezing.  Musculoskeletal:     Cervical back: Neck supple.  Skin:    General: Skin is warm and dry.     Capillary Refill: Capillary refill takes less than 2 seconds.  Neurological:     Mental Status: She is alert. Mental status is at baseline.  Psychiatric:        Mood and Affect: Mood normal.        Behavior: Behavior normal.           Assessment & Plan:   Problem List Items Addressed This Visit      Cardiovascular and Mediastinum   Hemorrhoids - Primary    Following with Dr. - no need for surgery currently.  Continue constipation management.         Other   Continuous leakage of urine    Improving with pelvic PT. Appreciate care      Other fatigue    Likely 2/2 vit d deficiency and low iron. Replacing with improvement in symptoms.           Return in about 6 months (around 08/17/2020) for annual with pap .  08/19/2020, MD  This visit occurred during the SARS-CoV-2 public health emergency.  Safety protocols were in place, including screening questions prior to the visit, additional usage of staff PPE, and extensive cleaning of exam room while observing appropriate contact time as indicated for disinfecting solutions.

## 2020-02-15 NOTE — Assessment & Plan Note (Signed)
Following with Dr. Claudine Mouton - no need for surgery currently. Continue constipation management.

## 2020-02-29 ENCOUNTER — Ambulatory Visit: Payer: Medicaid Other | Attending: Family Medicine | Admitting: Physical Therapy

## 2020-03-05 ENCOUNTER — Ambulatory Visit: Payer: Self-pay | Admitting: Surgery

## 2020-04-04 ENCOUNTER — Encounter: Payer: Self-pay | Admitting: *Deleted

## 2020-05-18 ENCOUNTER — Emergency Department: Payer: Medicaid Other

## 2020-05-18 ENCOUNTER — Emergency Department
Admission: EM | Admit: 2020-05-18 | Discharge: 2020-05-18 | Disposition: A | Discharge status: Home / Self Care | Payer: Medicaid Other | Attending: Emergency Medicine | Admitting: Emergency Medicine

## 2020-05-18 ENCOUNTER — Other Ambulatory Visit: Payer: Self-pay

## 2020-05-18 DIAGNOSIS — M79602 Pain in left arm: Secondary | ICD-10-CM | POA: Insufficient documentation

## 2020-05-18 DIAGNOSIS — R599 Enlarged lymph nodes, unspecified: Secondary | ICD-10-CM | POA: Insufficient documentation

## 2020-05-18 DIAGNOSIS — R590 Localized enlarged lymph nodes: Secondary | ICD-10-CM | POA: Diagnosis not present

## 2020-05-18 DIAGNOSIS — M79622 Pain in left upper arm: Secondary | ICD-10-CM | POA: Diagnosis not present

## 2020-05-18 DIAGNOSIS — M7989 Other specified soft tissue disorders: Secondary | ICD-10-CM | POA: Diagnosis not present

## 2020-05-18 DIAGNOSIS — T50Z95A Adverse effect of other vaccines and biological substances, initial encounter: Secondary | ICD-10-CM | POA: Insufficient documentation

## 2020-05-18 DIAGNOSIS — R591 Generalized enlarged lymph nodes: Secondary | ICD-10-CM | POA: Diagnosis not present

## 2020-05-18 MED ORDER — METAXALONE 800 MG PO TABS: 800.0000 mg | ORAL_TABLET | Freq: Three times a day (TID) | ORAL | 0 refills | Status: AC

## 2020-05-18 MED ORDER — CYCLOBENZAPRINE HCL 5 MG PO TABS: 5.0000 mg | ORAL_TABLET | Freq: Three times a day (TID) | ORAL | 0 refills | Status: DC | PRN

## 2020-05-18 MED ORDER — ACETAMINOPHEN 160 MG/5ML PO SOLN
650.0000 mg | Freq: Once | ORAL | Status: AC
  Administered 2020-05-18: 650 mg via ORAL
  Filled 2020-05-18: qty 20.3

## 2020-05-18 MED ORDER — ACETAMINOPHEN 160 MG/5ML PO SUSP
ORAL | Status: AC
  Filled 2020-05-18: qty 25

## 2020-05-18 NOTE — ED Notes (Signed)
Pt reports covid booster to L arm on Monday c/o pain to arm.

## 2020-05-18 NOTE — ED Triage Notes (Signed)
Pt states on this past Monday she got her covid-19 booster shot and then when she woke up on Tuesday she noted to have swelling and arm pain. Pt states the swelling has gone down, but the pain continues.

## 2020-05-18 NOTE — Discharge Instructions (Addendum)
Your exam and ultrasound are normal and reassuring at this time.  Symptoms likely represent a reaction to your recent vaccine booster.  No underlying signs of vessel obstruction or DVT in the upper extremity.  Follow-up with your primary provider for ongoing symptoms.

## 2020-05-18 NOTE — ED Provider Notes (Signed)
[facility] Emergency Department Provider Note ____________________________________________  Time seen: 2155  I have reviewed the triage vital signs and the nursing notes.  HISTORY  Chief Complaint  Arm Pain  HPI Shannon [name] is a 34 y.o. female presents to the ED for evaluation of continued left upper extremity pain and intermittent swelling.  Patient reports she received the flu shot on 01/30/2021.  Since that time she is experienced some intermittent swelling to the lymph nodes of the axilla as well as now some numbness, tingling, and achy pain from the proximal humerus to the wrist.  Patient denies any grip changes, skin temp/color changes, or any weakness.  She has experienced paresthesias that she describes as "pins-and-needles."  Patient presents to the ED for evaluation of arm pain, noting concern for possible take any medications in the interim for symptom relief.  She denies any other complaints including chest pain, shortness of breath, fever, chills, or sweats.   Past Medical History:  Diagnosis Date  . Asthma   . Depression   . Hypertension    gestational  . Migraine   . PCOS (polycystic ovarian syndrome)   . PCOS (polycystic ovarian syndrome)   . Urinary incontinence     Patient Active Problem List   Diagnosis Date Noted  . Hemorrhoids 12/26/2019  . PCOS (polycystic ovarian syndrome) 11/07/2019  . Continuous leakage of urine 11/07/2019  . Other fatigue 11/07/2019  . Class 2 obesity due to excess calories without serious comorbidity with body mass index (BMI) of 38.0 to 38.9 in adult 11/07/2019    Past Surgical History:  Procedure Laterality Date  . TONSILLECTOMY    . TONSILLECTOMY  2007    Prior to Admission medications   Medication Sig Start Date End Date Taking? Authorizing Provider  Ferrous Sulfate (IRON) 325 (65 Fe) MG TABS Take 1 tablet (325 mg total) by mouth daily. Patient not taking: Reported on 02/15/2020 11/08/19   Lynnda Child, MD  metaxalone (SKELAXIN) 800 MG tablet Take 1 tablet (800 mg total) by mouth 3 (three) times daily for 10 days. 05/18/20 05/28/20  Jermarion Poffenberger, Charlesetta Ivory, PA-C    Allergies Lincomycin, Clindamycin/lincomycin, and Penicillins  Family History  Problem Relation Age of Onset  . Diabetes Maternal Aunt   . Diabetes Paternal Aunt   . Healthy Maternal Grandmother   . Colon cancer Paternal Grandmother 22  . Stroke Paternal Grandfather 33  . Hypertension Paternal Grandfather   . Hypertension Mother   . Hypertension Father   . Asthma Father   . Healthy Sister   . Asthma Son   . Learning disabilities Son   . Autism Son     Social History Social History   Tobacco Use  . Smoking status: Never Smoker  . Smokeless tobacco: Never Used  Vaping Use  . Vaping Use: Never used  Substance Use Topics  . Alcohol use: No  . Drug use: No    Review of Systems  Constitutional: Negative for fever. Eyes: Negative for visual changes. ENT: Negative for sore throat. Cardiovascular: Negative for chest pain. Respiratory: Negative for shortness of breath. Gastrointestinal: Negative for abdominal pain, vomiting and diarrhea. Genitourinary: Negative for dysuria. Musculoskeletal: Negative for back pain. Skin: Negative for rash. Neurological: Negative for headaches, focal weakness or numbness. LUE paresthesias and swelling ____________________________________________  PHYSICAL EXAM:  VITAL SIGNS: ED Triage Vitals  Enc Vitals Group     BP 05/18/20 2119 137/74     Pulse Rate 05/18/20 2119 88  Resp 05/18/20 2119 18     Temp 05/18/20 2119 99.1 F (37.3 C)     Temp src --      SpO2 05/18/20 2119 100 %     Weight 05/18/20 2120 245 lb (111.1 kg)     Height 05/18/20 2120 5\' 7"  (1.702 m)     Head Circumference --      Peak Flow --      Pain Score 05/18/20 2120 6     Pain Loc --      Pain Edu? --      Excl. in GC? --     Constitutional: Alert and oriented. Well appearing and in  no distress. Head: Normocephalic and atraumatic. Eyes: Conjunctivae are normal. Normal extraocular movements Cardiovascular: Normal rate, regular rhythm. Normal distal pulses. Normal capillary refill. Lymphatic: tender, palpable left axillary nodes appreciated.  Respiratory: Normal respiratory effort. No wheezes/rales/rhonchi. Gastrointestinal: Soft and nontender. No distention. Musculoskeletal: Nontender with normal range of motion in all extremities.  Neurologic: Cranial nerves II through XII grossly intact.  Normal gait without ataxia.  Intrinsic and opposition testing noted.  Normal speech and language. No gross focal neurologic deficits are appreciated. Skin:  Skin is warm, dry and intact. No rash noted.  No erythema, edema, effusion, induration, warmth, or tenderness.  No mottling of the skin, and no CCE distally. Psychiatric: Mood and affect are normal. Patient exhibits appropriate insight and judgment. ____________________________________________   RADIOLOGY  2121 Venous Doppler LUE  Negative ____________________________________________  PROCEDURES  Procedures  Tylenol suspension 650 mg PO  ____________________________________________  INITIAL IMPRESSION / ASSESSMENT AND PLAN / ED COURSE  Patient with ED evaluation of ongoing left upper extremity pain following her Covid booster vaccine.  Patient presents for evaluation of her symptoms, with concern for possible DVT.  Exam as above Return to this time.  Ultrasound is also negative for any finding of acute DVT.  She does have reactive lymph nodes to the axilla which is consistent with her report of her recent vaccine.  Patient is discharged with instructions to take over-the-counter Tylenol suspension (as she cannot swallow pills), as well as Skelaxin which can be crushed or chewed.  She will follow-up with her primary provider for ongoing symptoms.  [name] was evaluated in Emergency Department on 05/18/2020 for the  symptoms described in the history of present illness. She was evaluated in the context of the global COVID-19 pandemic, which necessitated consideration that the patient might be at risk for infection with the SARS-CoV-2 virus that causes COVID-19. Institutional protocols and algorithms that pertain to the evaluation of patients at risk for COVID-19 are in a state of rapid change based on information released by regulatory bodies including the CDC and federal and state organizations. These policies and algorithms were followed during the patient's care in the ED. ____________________________________________  FINAL CLINICAL IMPRESSION(S) / ED DIAGNOSES  Final diagnoses:  Left arm pain  Reactive lymphadenopathy  Vaccine reaction, initial encounter      05/20/2020, PA-C 05/18/20 2326    05/20/20, MD 05/20/20 1520

## 2020-05-18 NOTE — ED Notes (Signed)
U/S in room at this time. 

## 2020-08-21 ENCOUNTER — Encounter: Payer: Medicaid Other | Admitting: Family Medicine

## 2020-12-02 ENCOUNTER — Other Ambulatory Visit: Payer: Self-pay

## 2020-12-02 ENCOUNTER — Ambulatory Visit
Admission: EM | Admit: 2020-12-02 | Discharge: 2020-12-02 | Disposition: A | Discharge status: Home / Self Care | Payer: Medicaid Other | Attending: Family Medicine | Admitting: Family Medicine

## 2020-12-02 DIAGNOSIS — R590 Localized enlarged lymph nodes: Secondary | ICD-10-CM

## 2020-12-02 NOTE — ED Provider Notes (Signed)
[facility]    CSN: [id number] Arrival date & time: 12/02/20  1000      History   Chief Complaint Chief Complaint  Patient presents with   Lymphadenopathy   HPI  35 year old female presents with the above complaint.  Patient states that she felt a swollen area behind her right ear yesterday.  She states that it is tender.  Seems to be worsening.  Pain 4/10 in severity.  No relieving factors.  No respiratory symptoms.  She is otherwise feeling well.  No documented fever.  No other complaints at this time.  Past Medical History:  Diagnosis Date   Asthma    Depression    Hypertension    gestational   Migraine    PCOS (polycystic ovarian syndrome)    PCOS (polycystic ovarian syndrome)    Urinary incontinence     Patient Active Problem List   Diagnosis Date Noted   Hemorrhoids 12/26/2019   PCOS (polycystic ovarian syndrome) 11/07/2019   Continuous leakage of urine 11/07/2019   Other fatigue 11/07/2019   Class 2 obesity due to excess calories without serious comorbidity with body mass index (BMI) of 38.0 to 38.9 in adult 11/07/2019    Past Surgical History:  Procedure Laterality Date   TONSILLECTOMY     TONSILLECTOMY  2007    OB History     Gravida  1   Para  1   Term  1   Preterm      AB      Living  1      SAB      IAB      Ectopic      Multiple      Live Births  1            Home Medications    Prior to Admission medications   Medication Sig Start Date End Date Taking? Authorizing Provider  cholecalciferol (VITAMIN D3) 25 MCG (1000 UNIT) tablet Take 1,000 Units by mouth daily.   Yes [provider]  Fiber Adult Gummies 2 g CHEW Chew by mouth.   Yes [provider]  Ferrous Sulfate (IRON) 325 (65 Fe) MG TABS Take 1 tablet (325 mg total) by mouth daily. Patient not taking: No sig reported 11/08/19   Lynnda Child, MD    Family History Family History  Problem Relation Age of Onset   Diabetes Maternal  Aunt    Diabetes Paternal Aunt    Healthy Maternal Grandmother    Colon cancer Paternal Grandmother 37   Stroke Paternal Grandfather 95   Hypertension Paternal Grandfather    Hypertension Mother    Hypertension Father    Asthma Father    Healthy Sister    Asthma Son    Learning disabilities Son    Autism Son     Social History Social History   Tobacco Use   Smoking status: Never   Smokeless tobacco: Never  Vaping Use   Vaping Use: Never used  Substance Use Topics   Alcohol use: No   Drug use: No     Allergies   Lincomycin, Clindamycin/lincomycin, and Penicillins   Review of Systems Review of Systems  Constitutional: Negative.   Hematological:  Positive for adenopathy.   Physical Exam Triage Vital Signs ED Triage Vitals  Enc Vitals Group     BP 12/02/20 1019 127/79     Pulse Rate 12/02/20 1019 86     Resp 12/02/20 1019 16     Temp  12/02/20 1019 98.2 F (36.8 C)     Temp Source 12/02/20 1019 Oral     SpO2 12/02/20 1019 100 %     Weight 12/02/20 1015 250 lb (113.4 kg)     Height 12/02/20 1015 5\' 7"  (1.702 m)     Head Circumference --      Peak Flow --      Pain Score 12/02/20 1015 4     Pain Loc --      Pain Edu? --      Excl. in GC? --    Updated Vital Signs BP 127/79 (BP Location: Right Arm)   Pulse 86   Temp 98.2 F (36.8 C) (Oral)   Resp 16   Ht 5\' 7"  (1.702 m)   Wt 113.4 kg   SpO2 100%   BMI 39.16 kg/m   Visual Acuity Right Eye Distance:   Left Eye Distance:   Bilateral Distance:    Right Eye Near:   Left Eye Near:    Bilateral Near:     Physical Exam Vitals and nursing note reviewed.  Constitutional:      General: She is not in acute distress.    Appearance: Normal appearance. She is not ill-appearing.  HENT:     Head: Normocephalic and atraumatic.     Right Ear: Tympanic membrane normal.     Left Ear: Tympanic membrane normal.     Ears:     Comments: Small palpable posterior auricular lymph node noted on the right.     Mouth/Throat:     Pharynx: Oropharynx is clear. No oropharyngeal exudate.  Eyes:     General:        Right eye: No discharge.        Left eye: No discharge.     Conjunctiva/sclera: Conjunctivae normal.  Cardiovascular:     Rate and Rhythm: Normal rate and regular rhythm.     Heart sounds: No murmur heard. Pulmonary:     Effort: Pulmonary effort is normal.     Breath sounds: Normal breath sounds. No wheezing or rales.  Neurological:     Mental Status: She is alert.  Psychiatric:        Mood and Affect: Mood normal.        Behavior: Behavior normal.    UC Treatments / Results  Labs (all labs ordered are listed, but only abnormal results are displayed) Labs Reviewed - No data to display  EKG   Radiology No results found.  Procedures Procedures (including critical care time)  Medications Ordered in UC Medications - No data to display  Initial Impression / Assessment and Plan / UC Course  I have reviewed the triage vital signs and the nursing notes.  Pertinent labs & imaging results that were available during my care of the patient were reviewed by me and considered in my medical decision making (see chart for details).    35 year old female presents with a single posterior auricular lymph node.  Exam is unrevealing for potential source of infection.  Advised that this is likely going to self resolve.  Over-the-counter analgesics.  Advise follow-up with ENT if persists.  Final Clinical Impressions(s) / UC Diagnoses   Final diagnoses:  Posterior auricular lymphadenopathy     Discharge Instructions      Keep a close eye on the area.  OTC Analgesics.  This should self resolve. If persists for 4 weeks, you need to see ENT for discussion about biopsy. If this worsens (red, warm, gets  larger), you need to be reevaluated.   Take care  Dr. Adriana Simas    ED Prescriptions   None    PDMP not reviewed this encounter.   Tommie Sams, DO 12/02/20 1250

## 2020-12-02 NOTE — Discharge Instructions (Addendum)
Keep a close eye on the area.  OTC Analgesics.  This should self resolve. If persists for 4 weeks, you need to see ENT for discussion about biopsy. If this worsens (red, warm, gets larger), you need to be reevaluated.   Take care  Dr. Adriana Simas

## 2020-12-02 NOTE — ED Triage Notes (Signed)
Patient states that she felt a swollen area behind her ear yesterday. States that the area is tender when touched.

## 2020-12-09 ENCOUNTER — Ambulatory Visit: Payer: Medicaid Other | Admitting: Family Medicine

## 2021-01-22 DIAGNOSIS — Z113 Encounter for screening for infections with a predominantly sexual mode of transmission: Secondary | ICD-10-CM | POA: Diagnosis not present

## 2021-02-26 ENCOUNTER — Other Ambulatory Visit: Payer: Self-pay | Admitting: Family Medicine

## 2021-02-26 DIAGNOSIS — E611 Iron deficiency: Secondary | ICD-10-CM

## 2021-09-18 ENCOUNTER — Ambulatory Visit: Payer: Medicaid Other | Admitting: Family Medicine

## 2021-10-07 DIAGNOSIS — N6452 Nipple discharge: Secondary | ICD-10-CM | POA: Diagnosis not present

## 2021-11-11 DIAGNOSIS — Z880 Allergy status to penicillin: Secondary | ICD-10-CM | POA: Diagnosis not present

## 2021-11-11 DIAGNOSIS — R55 Syncope and collapse: Secondary | ICD-10-CM | POA: Diagnosis not present

## 2021-11-11 DIAGNOSIS — R197 Diarrhea, unspecified: Secondary | ICD-10-CM | POA: Diagnosis not present

## 2021-11-11 DIAGNOSIS — R110 Nausea: Secondary | ICD-10-CM | POA: Diagnosis not present

## 2021-11-12 ENCOUNTER — Telehealth: Payer: Self-pay

## 2021-11-12 NOTE — Telephone Encounter (Signed)
Transition Care Management Unsuccessful Follow-up Telephone Call  Date of discharge and from where:  11/11/2021-[facility]  Attempts:  1st Attempt  Reason for unsuccessful TCM follow-up call:  Left voice message

## 2021-11-13 NOTE — Telephone Encounter (Signed)
Transition Care Management Unsuccessful Follow-up Telephone Call  Date of discharge and from where:  11/11/2021-[facility]  Attempts:  2nd Attempt  Reason for unsuccessful TCM follow-up call:  Left voice message

## 2021-11-14 NOTE — Telephone Encounter (Signed)
Transition Care Management Unsuccessful Follow-up Telephone Call  Date of discharge and from where:  11/11/2021-[facility]  Attempts:  3rd Attempt  Reason for unsuccessful TCM follow-up call:  Unable to reach patient

## 2022-01-16 ENCOUNTER — Ambulatory Visit
Admission: EM | Admit: 2022-01-16 | Discharge: 2022-01-16 | Disposition: A | Discharge status: Home / Self Care | Payer: Medicaid Other | Attending: Emergency Medicine | Admitting: Emergency Medicine

## 2022-01-16 DIAGNOSIS — J029 Acute pharyngitis, unspecified: Secondary | ICD-10-CM | POA: Insufficient documentation

## 2022-01-16 LAB — GROUP A STREP BY PCR: Group A Strep by PCR: NOT DETECTED

## 2022-01-16 MED ORDER — SULFAMETHOXAZOLE-TRIMETHOPRIM 200-40 MG/5ML PO SUSP: 20.0000 mL | Freq: Two times a day (BID) | ORAL | 0 refills | Status: AC

## 2022-01-16 NOTE — ED Provider Notes (Addendum)
[facility]    CSN: [id number] Arrival date & time: 01/16/22  1639      History   Chief Complaint Chief Complaint  Patient presents with   Sore Throat    Ear pain, sinus congestion and drainage - Entered by patient    HPI Shannon [name] is a 36 y.o. female.   HPI  Shannon [name] is in today for sore throat, ear pain, nasal congestion and drainage since yesterday. She  has a past medical history of Asthma, Depression, Hypertension, and Migraine. She reports family exposure to other respiratory illnesses. Denies fever, chills, headache, dizziness, visual changes, chest pain, nausea, vomiting, constipation, diarrhea, or any edema. She has not treated the symptoms at this time.   Past Medical History:  Diagnosis Date   Asthma    Depression    Hypertension    gestational   Migraine    PCOS (polycystic ovarian syndrome)    PCOS (polycystic ovarian syndrome)    Urinary incontinence     Patient Active Problem List   Diagnosis Date Noted   Hemorrhoids 12/26/2019   PCOS (polycystic ovarian syndrome) 11/07/2019   Continuous leakage of urine 11/07/2019   Other fatigue 11/07/2019   Class 2 obesity due to excess calories without serious comorbidity with body mass index (BMI) of 38.0 to 38.9 in adult 11/07/2019    Past Surgical History:  Procedure Laterality Date   TONSILLECTOMY     TONSILLECTOMY  2007    OB History     Gravida  1   Para  1   Term  1   Preterm      AB      Living  1      SAB      IAB      Ectopic      Multiple      Live Births  1            Home Medications    Prior to Admission medications   Medication Sig Start Date End Date Taking? Authorizing Provider  sulfamethoxazole-trimethoprim (BACTRIM) 200-40 MG/5ML suspension Take 20 mLs by mouth 2 (two) times daily for 7 days. 01/16/22 01/23/22 Yes Barbette Merino, NP  cholecalciferol (VITAMIN D3) 25 MCG (1000 UNIT) tablet Take 1,000 Units by mouth daily.    [provider]  Ferrous Sulfate (IRON) 325 (65 Fe) MG TABS Take 1 tablet (325 mg total) by mouth daily. Patient not taking: No sig reported 11/08/19   Lynnda Child, MD  Fiber Adult Gummies 2 g CHEW Chew by mouth.    [provider]    Family History Family History  Problem Relation Age of Onset   Diabetes Maternal Aunt    Diabetes Paternal Aunt    Healthy Maternal Grandmother    Colon cancer Paternal Grandmother 24   Stroke Paternal Grandfather 47   Hypertension Paternal Grandfather    Hypertension Mother    Hypertension Father    Asthma Father    Healthy Sister    Asthma Son    Learning disabilities Son    Autism Son     Social History Social History   Tobacco Use   Smoking status: Never   Smokeless tobacco: Never  Vaping Use   Vaping Use: Never used  Substance Use Topics   Alcohol use: No   Drug use: No     Allergies   Lincomycin, Clindamycin/lincomycin, and Penicillins   Review of Systems Review of Systems   Physical  Exam Triage Vital Signs ED Triage Vitals  Enc Vitals Group     BP 01/16/22 1646 129/77     Pulse Rate 01/16/22 1646 (!) 107     Resp 01/16/22 1646 18     Temp 01/16/22 1646 98.5 F (36.9 C)     Temp Source 01/16/22 1646 Oral     SpO2 01/16/22 1646 100 %     Weight --      Height --      Head Circumference --      Peak Flow --      Pain Score 01/16/22 1647 6     Pain Loc --      Pain Edu? --      Excl. in GC? --    No data found.  Updated Vital Signs BP 129/77 (BP Location: Left Arm)   Pulse (!) 107   Temp 98.5 F (36.9 C) (Oral)   Resp 18   SpO2 100%   Visual Acuity Right Eye Distance:   Left Eye Distance:   Bilateral Distance:    Right Eye Near:   Left Eye Near:    Bilateral Near:     Physical Exam Constitutional:      Appearance: She is obese.  HENT:     Head: Normocephalic and atraumatic.     Right Ear: Tympanic membrane normal.     Left Ear: Tympanic membrane normal.     Mouth/Throat:      Mouth: Mucous membranes are moist.     Tonsils: Tonsillar exudate (left throat x1) present.  Eyes:     Conjunctiva/sclera: Conjunctivae normal.     Pupils: Pupils are equal, round, and reactive to light.  Cardiovascular:     Rate and Rhythm: Normal rate.     Heart sounds: Normal heart sounds.  Pulmonary:     Effort: Pulmonary effort is normal. No respiratory distress.     Breath sounds: No stridor. No wheezing, rhonchi or rales.  Chest:     Chest wall: No tenderness.  Musculoskeletal:     Cervical back: Normal range of motion.  Skin:    General: Skin is warm and dry.     Capillary Refill: Capillary refill takes less than 2 seconds.  Neurological:     General: No focal deficit present.     Mental Status: She is alert and oriented to person, place, and time.  Psychiatric:        Mood and Affect: Mood normal.        Behavior: Behavior normal.      UC Treatments / Results  Labs (all labs ordered are listed, but only abnormal results are displayed) Labs Reviewed  GROUP A STREP BY PCR    EKG   Radiology No results found.  Procedures Procedures (including critical care time)  Medications Ordered in UC Medications - No data to display  Initial Impression / Assessment and Plan / UC Course  I have reviewed the triage vital signs and the nursing notes.  Pertinent labs & imaging results that were available during my care of the patient were reviewed by me and considered in my medical decision making (see chart for details).     Sore throat Final Clinical Impressions(s) / UC Diagnoses   Final diagnoses:  Sore throat  Pharyngitis, unspecified etiology     Discharge Instructions      Your Strep Test are all negative. You may have an Upper Respiratory Infection or pharyngitis  I have prescribed Bactrim 20 ml  BID x 7 days  I encourage Mucinex D liquid twice a day for 7 days.  We encourage conservative treatment with symptom relief. We encourage you to use Tylenol  alternating with Ibuprofen for your fever if not contraindicated. (Remember to use as directed do not exceed daily dosing recommendations) We also encourage salt water gargles for your sore throat. You should also consider throat lozenges and chloraseptic spray.  Your cough can be soothed with a cough suppressant. We have prescribed you a cough suppressant to be taken as  directed.       ED Prescriptions     Medication Sig Dispense Auth. Provider   sulfamethoxazole-trimethoprim (BACTRIM) 200-40 MG/5ML suspension Take 20 mLs by mouth 2 (two) times daily for 7 days. 280 mL Barbette Merino, NP      PDMP not reviewed this encounter.   Barbette Merino, NP 01/16/22 1745    Barbette Merino, NP 01/16/22 [phone]

## 2022-01-16 NOTE — Discharge Instructions (Addendum)
Your Strep Test are all negative. You may have an Upper Respiratory Infection or pharyngitis  I have prescribed Bactrim 20 ml BID x 7 days  I encourage Mucinex D liquid twice a day for 7 days.  We encourage conservative treatment with symptom relief. We encourage you to use Tylenol alternating with Ibuprofen for your fever if not contraindicated. (Remember to use as directed do not exceed daily dosing recommendations) We also encourage salt water gargles for your sore throat. You should also consider throat lozenges and chloraseptic spray.  Your cough can be soothed with a cough suppressant. We have prescribed you a cough suppressant to be taken as  directed.

## 2022-01-16 NOTE — ED Triage Notes (Signed)
Pt present sore throat with bilateral ear pain. Pt states having nasal congestion with drainage. Symptoms started yesterday.

## 2022-06-02 ENCOUNTER — Ambulatory Visit
Admission: EM | Admit: 2022-06-02 | Discharge: 2022-06-02 | Disposition: A | Discharge status: Home / Self Care | Payer: Medicaid Other | Attending: Emergency Medicine | Admitting: Emergency Medicine

## 2022-06-02 DIAGNOSIS — R21 Rash and other nonspecific skin eruption: Secondary | ICD-10-CM

## 2022-06-02 MED ORDER — PERMETHRIN 5 % EX CREA: TOPICAL_CREAM | CUTANEOUS | 0 refills | Status: AC

## 2022-06-02 MED ORDER — HYDROXYZINE HCL 25 MG PO TABS: 25.0000 mg | ORAL_TABLET | Freq: Four times a day (QID) | ORAL | 0 refills | Status: AC | PRN

## 2022-06-02 MED ORDER — PREDNISONE 10 MG (21) PO TBPK: ORAL_TABLET | ORAL | 0 refills | Status: AC

## 2022-06-02 MED ORDER — MUPIROCIN 2 % EX OINT: 1.0000 | TOPICAL_OINTMENT | Freq: Two times a day (BID) | CUTANEOUS | 0 refills | Status: AC

## 2022-06-02 NOTE — ED Triage Notes (Signed)
Pt used cortisone cream for the rash and states that it did not help.

## 2022-06-02 NOTE — Discharge Instructions (Signed)
Try the permethrin in addition to the prednisone, Claritin or Zyrtec, and if that does not work, then Atarax.  Bactroban to prevent secondary infection.  If this is scabies, then the permethrin will stop the rash from spreading.  You may still itch for a week or 2 after finishing the permethrin.  Scabies is usually transmitted through close physical contact, but may be transmitted through clothing, linens, or towels. Apply the permethrin from head to soles of feet at bedtime, leave on for 8-14 hours. Mites tend to persist under the fingernails. Trim them, scrub beneath them, and apply the permetrhin under your nails. Repeat 1 week later. Wash all clothing, bedding, and towels in hot water, dry cleaned, or placed through the heat cycle of a dryer to prevent reinfection. Or you can place all bedding and clothing that might be infected in sealed plastic bags for 72 hours.  The itching will not go away immediately. Continued itching does not mean that the treatment was ineffective. Dead mites and eggs will cause an immune response but will eventually go away as the skin turns over.   Go to www.goodrx.com  or www.costplusdrugs.com to look up your medications. This will give you a list of where you can find your prescriptions at the most affordable prices. Or ask the pharmacist what the cash price is, or if they have any other discount programs available to help make your medication more affordable. This can be less expensive than what you would pay with insurance.

## 2022-06-02 NOTE — ED Triage Notes (Signed)
Pt c/o rash x4days  Pt states that she woke up on Saturday and her arms felt "tingly". Pt has a rash along the both arms and inner thighs

## 2022-06-02 NOTE — ED Provider Notes (Signed)
HPI  SUBJECTIVE:  Shannon [name] is a 36 y.o. female who presents with an erythematous, intensely pruritic rash in the flexures of her elbows, forearms, and left inner thigh starting several days ago.  She states it started in her left AC fossa and has spread from there.  She states it feels irritated.  She states the itching is worse at night.  It does not burn, it is not painful.  No fevers, crusting, sensation of being bitten at night, blood on the bed clothes in the morning, new lotions, soaps, detergents.  No recent antibiotics, change in medications, contacts with a similar rash.  She tried cortisone cream with very temporary improvement in her symptoms.  She also tried 0.1 triamcinolone ointment twice without improvement in her symptoms.  Symptoms are worse with things rubbing against the rash and palpation.  She has a past medical history of asthma, PCOS.  LMP: Now.  Denies possibility being pregnant.  PCP: [facility].    Past Medical History:  Diagnosis Date   Asthma    Depression    Hypertension    gestational   Migraine    PCOS (polycystic ovarian syndrome)    PCOS (polycystic ovarian syndrome)    Urinary incontinence     Past Surgical History:  Procedure Laterality Date   TONSILLECTOMY     TONSILLECTOMY  2007    Family History  Problem Relation Age of Onset   Diabetes Maternal Aunt    Diabetes Paternal Aunt    Healthy Maternal Grandmother    Colon cancer Paternal Grandmother 67   Stroke Paternal Grandfather 12   Hypertension Paternal Grandfather    Hypertension Mother    Hypertension Father    Asthma Father    Healthy Sister    Asthma Son    Learning disabilities Son    Autism Son     Social History   Tobacco Use   Smoking status: Never   Smokeless tobacco: Never  Vaping Use   Vaping Use: Never used  Substance Use Topics   Alcohol use: No   Drug use: No    No current facility-administered medications for this encounter.  Current  Outpatient Medications:    ALTAVERA 0.15-30 MG-MCG tablet, Take 1 tablet by mouth daily., Disp: , Rfl:    cholecalciferol (VITAMIN D3) 25 MCG (1000 UNIT) tablet, Take 1,000 Units by mouth daily., Disp: , Rfl:    Ferrous Sulfate (IRON) 325 (65 Fe) MG TABS, Take 1 tablet (325 mg total) by mouth daily., Disp: 90 tablet, Rfl: 1   Fiber Adult Gummies 2 g CHEW, Chew by mouth., Disp: , Rfl:    hydrOXYzine (ATARAX) 25 MG tablet, Take 1 tablet (25 mg total) by mouth every 6 (six) hours as needed for itching., Disp: 20 tablet, Rfl: 0   mupirocin ointment (BACTROBAN) 2 %, Apply 1 Application topically 2 (two) times daily., Disp: 22 g, Rfl: 0   permethrin (ELIMITE) 5 % cream, Apply to affected area once, Disp: 60 g, Rfl: 0   predniSONE (STERAPRED UNI-PAK 21 TAB) 10 MG (21) TBPK tablet, Dispense one 6 day pack. Take as directed with food., Disp: 21 tablet, Rfl: 0  Allergies  Allergen Reactions   Lincomycin Hives   Clindamycin/Lincomycin Hives   Penicillins Other (See Comments)    Pt not sure was told she was allergic when she was younger.       ROS  As noted in HPI.   Physical Exam  BP 125/81 (BP Location: Left  Arm)   Pulse 92   Temp 98.5 F (36.9 C) (Oral)   Resp 16   Ht 5\' 7"  (1.702 m)   Wt 91.6 kg   LMP 06/02/2022   SpO2 100%   BMI 31.64 kg/m   Constitutional: Well developed, well nourished, no acute distress Eyes:  EOMI, conjunctiva normal bilaterally HENT: Normocephalic, atraumatic,mucus membranes moist Respiratory: Normal inspiratory effort Cardiovascular: Normal rate GI: nondistended skin: Nontender erythematous papular rash grouped in the flexural areas, and forearms.  No burrows between fingers.  No crusting.       Papular flesh-colored plaque on left inner thigh  Musculoskeletal: no deformities Neurologic: Alert & oriented x 3, no focal neuro deficits Psychiatric: Speech and behavior appropriate   ED Course   Medications - No data to display  No orders of  the defined types were placed in this encounter.   No results found for this or any previous visit (from the past 24 hour(s)). No results found.  ED Clinical Impression  1. Rash      ED Assessment/Plan     Presentation suggestive of a contact dermatitis, however, she is reporting itching worse at night which is suggestive of scabies.  Will send home with permethrin, a prednisone taper for 6 days, she is to start Claritin or Zyrtec, and if that does not work, then Atarax.  Will send home with Bactroban to prevent secondary infection.  She is to stop the topical steroids.  Follow-up with PCP as needed.  Discussed  MDM, treatment plan, and plan for follow-up with patient. patient agrees with plan.   Meds ordered this encounter  Medications   predniSONE (STERAPRED UNI-PAK 21 TAB) 10 MG (21) TBPK tablet    Sig: Dispense one 6 day pack. Take as directed with food.    Dispense:  21 tablet    Refill:  0   hydrOXYzine (ATARAX) 25 MG tablet    Sig: Take 1 tablet (25 mg total) by mouth every 6 (six) hours as needed for itching.    Dispense:  20 tablet    Refill:  0   mupirocin ointment (BACTROBAN) 2 %    Sig: Apply 1 Application topically 2 (two) times daily.    Dispense:  22 g    Refill:  0   permethrin (ELIMITE) 5 % cream    Sig: Apply to affected area once    Dispense:  60 g    Refill:  0      *This clinic note was created using 14/05/2022. Therefore, there may be occasional mistakes despite careful proofreading.  ?    Scientist, clinical (histocompatibility and immunogenetics), MD 06/02/22 [id number]

## 2022-11-11 IMAGING — US US EXTREM  UP VENOUS*L*
1 series · 13 of 24 positions shown · non-contrast
Comparison: None.

CLINICAL DATA: Pain and swelling after COVID booster.



[Series 1: us venous img upper uni left (dvt) · portal-venous · 13 of 39 slices shown]
[im 1/39]
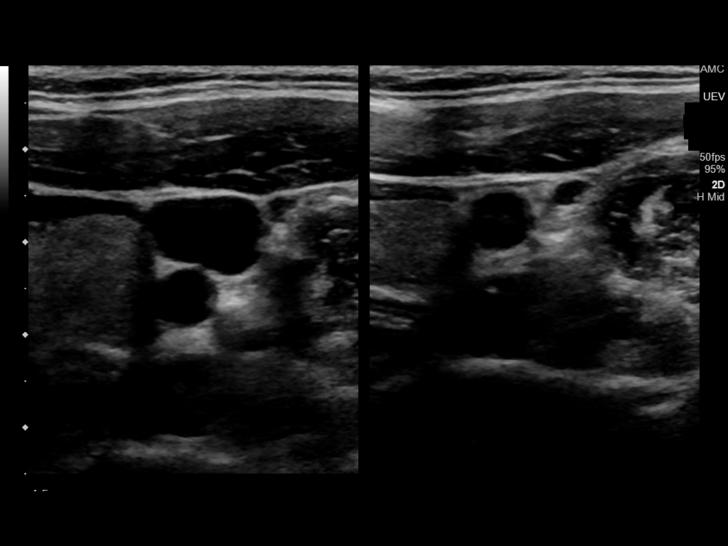
[im 4/39]
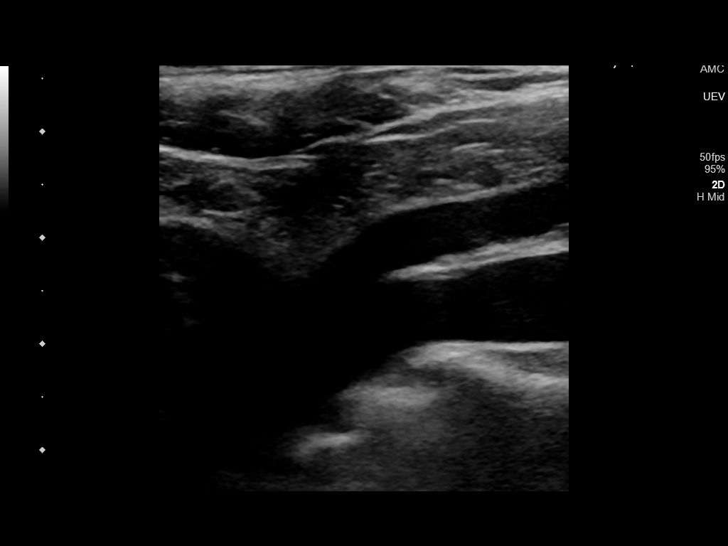
[im 7/39]
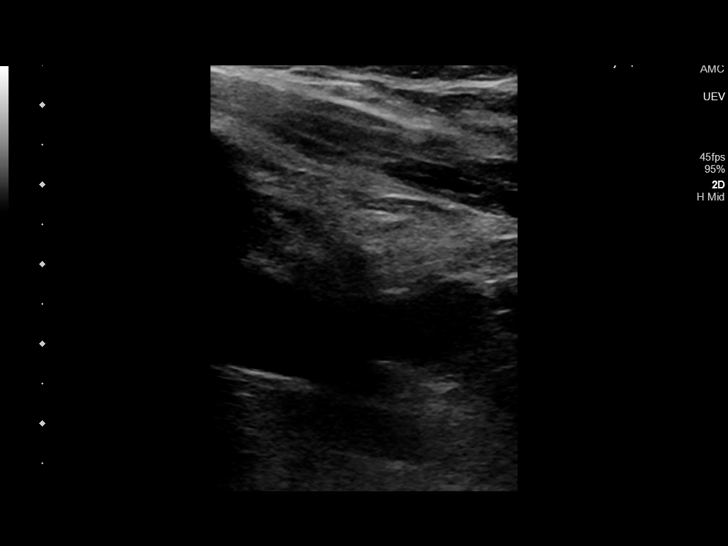
[im 10/39]
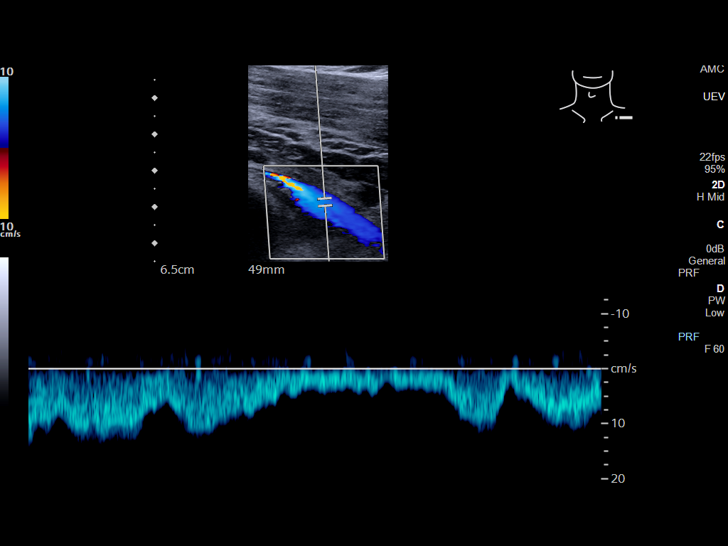
[im 14/39]
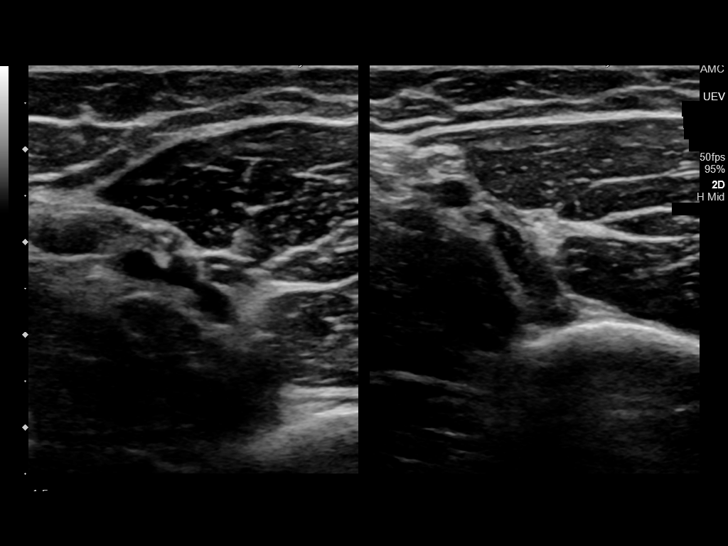
[im 17/39]
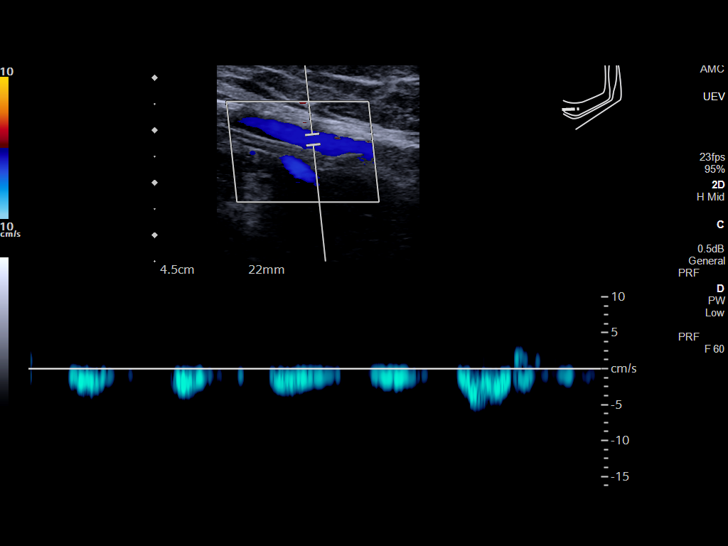
[im 20/39]
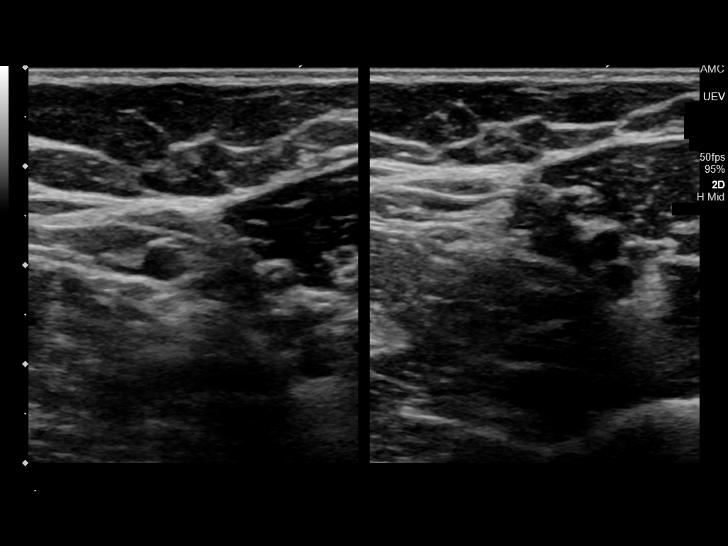
[im 22/39]
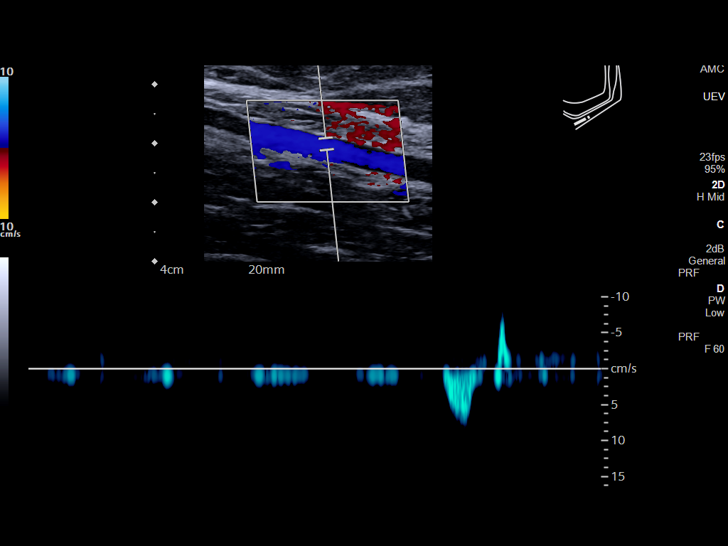
[im 25/39]
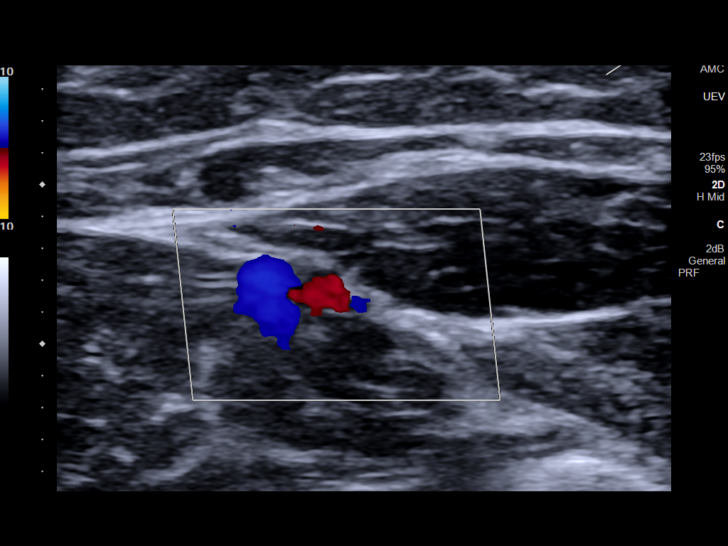
[im 29/39]
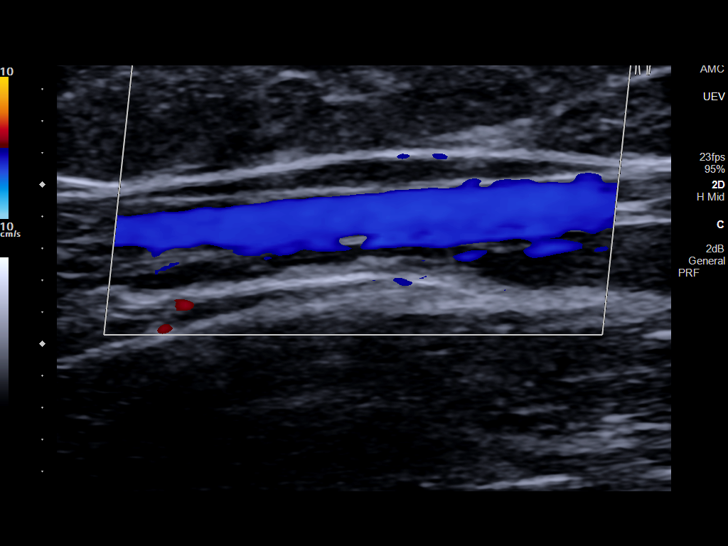
[im 32/39]
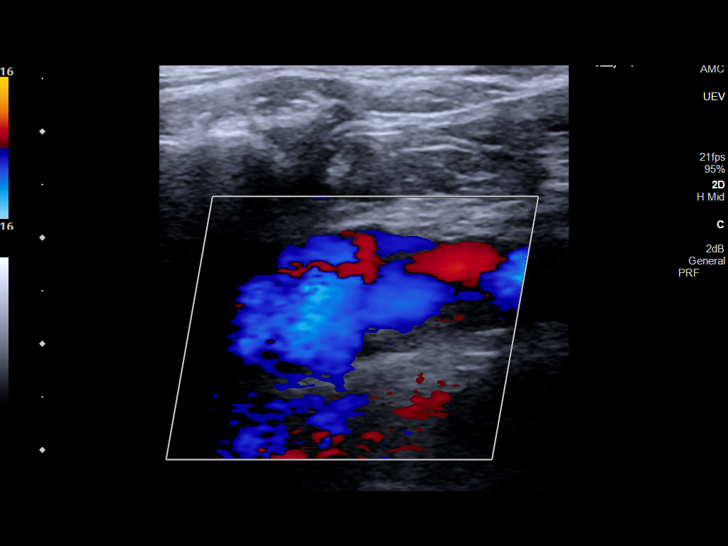
[im 35/39]
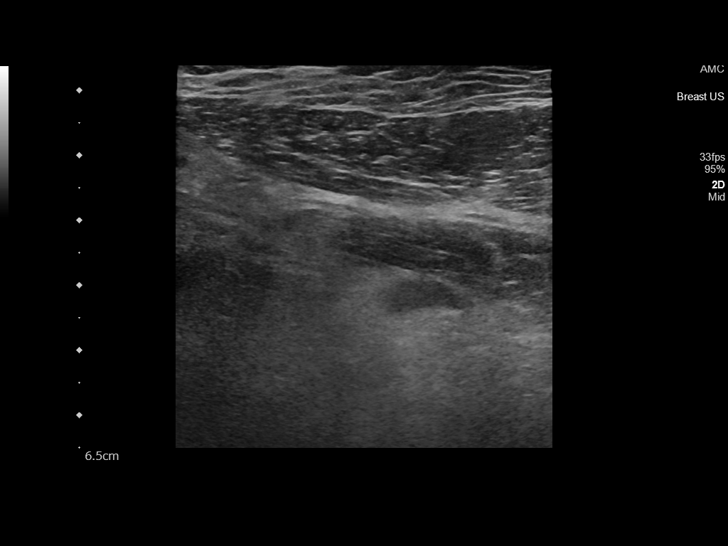
[im 39/39]
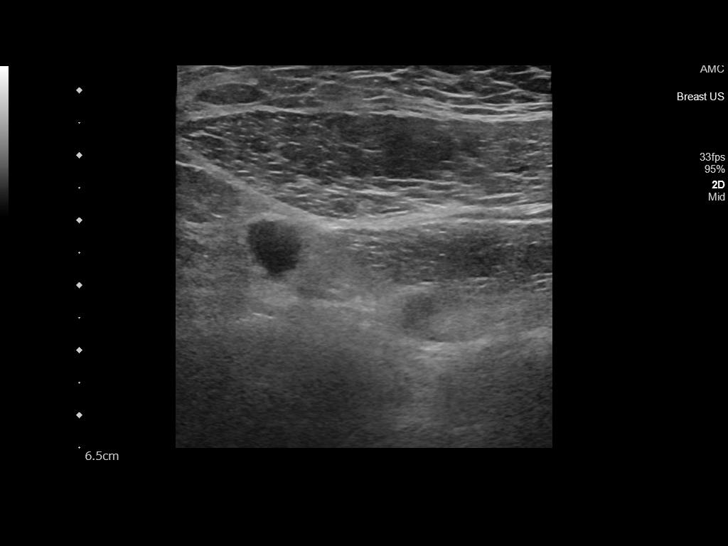

[13 of 24 positions shown; findings below may reference images not displayed]

FINDINGS: Contralateral Subclavian Vein: Respiratory phasicity is normal and
symmetric with the symptomatic side. No evidence of thrombus. Normal
compressibility.

Internal Jugular Vein: No evidence of thrombus. Normal
compressibility, respiratory phasicity and response to augmentation.

Subclavian Vein: No evidence of thrombus. Normal compressibility,
respiratory phasicity and response to augmentation.

Axillary Vein: No evidence of thrombus. Normal compressibility,
respiratory phasicity and response to augmentation.

Cephalic Vein: No evidence of thrombus. Normal compressibility,
respiratory phasicity and response to augmentation.

Basilic Vein: No evidence of thrombus. Normal compressibility,
respiratory phasicity and response to augmentation.

Brachial Veins: No evidence of thrombus. Normal compressibility,
respiratory phasicity and response to augmentation.

Radial Veins: No evidence of thrombus. Normal compressibility,
respiratory phasicity and response to augmentation.

Ulnar Veins: No evidence of thrombus. Normal compressibility,
respiratory phasicity and response to augmentation.

Venous Reflux:  None visualized.

Other Findings: Mildly enlarged left axillary lymph nodes are noted.
IMPRESSION: No evidence of DVT within the left upper extremity.

There are mildly enlarged left axillary lymph nodes which can be
seen in patients move undergone a recent vaccination.

## 2023-02-03 DIAGNOSIS — M79652 Pain in left thigh: Secondary | ICD-10-CM | POA: Diagnosis not present

## 2024-03-01 DIAGNOSIS — N760 Acute vaginitis: Secondary | ICD-10-CM | POA: Diagnosis not present

## 2024-03-01 DIAGNOSIS — R300 Dysuria: Secondary | ICD-10-CM | POA: Diagnosis not present

## 2024-03-01 DIAGNOSIS — B9689 Other specified bacterial agents as the cause of diseases classified elsewhere: Secondary | ICD-10-CM | POA: Diagnosis not present

## 2024-03-01 DIAGNOSIS — Z113 Encounter for screening for infections with a predominantly sexual mode of transmission: Secondary | ICD-10-CM | POA: Diagnosis not present
